# Patient Record
Sex: Male | Born: 1955 | Race: White | Hispanic: No | Marital: Married | Smoking: Never smoker
Health system: Southern US, Community
[De-identification: ages and names within clinical notes are randomized; demographics above are authoritative.]

## PROBLEM LIST (undated history)

## (undated) DIAGNOSIS — J310 Chronic rhinitis: Secondary | ICD-10-CM

## (undated) DIAGNOSIS — E785 Hyperlipidemia, unspecified: Secondary | ICD-10-CM

## (undated) DIAGNOSIS — R0981 Nasal congestion: Secondary | ICD-10-CM

## (undated) DIAGNOSIS — M25519 Pain in unspecified shoulder: Secondary | ICD-10-CM

## (undated) DIAGNOSIS — L989 Disorder of the skin and subcutaneous tissue, unspecified: Secondary | ICD-10-CM

## (undated) HISTORY — DX: Hyperlipidemia, unspecified: E78.5

## (undated) HISTORY — DX: Pain in unspecified shoulder: M25.519

## (undated) HISTORY — DX: Disorder of the skin and subcutaneous tissue, unspecified: L98.9

## (undated) HISTORY — DX: Nasal congestion: R09.81

## (undated) HISTORY — DX: Chronic rhinitis: J31.0

---

## 1998-02-15 DIAGNOSIS — E785 Hyperlipidemia, unspecified: Secondary | ICD-10-CM

## 1998-02-15 HISTORY — DX: Hyperlipidemia, unspecified: E78.5

## 1998-02-15 HISTORY — PX: OTHER SURGICAL HISTORY: SHX169

## 2003-02-16 DIAGNOSIS — J310 Chronic rhinitis: Secondary | ICD-10-CM

## 2003-02-16 HISTORY — DX: Chronic rhinitis: J31.0

## 2004-04-03 ENCOUNTER — Ambulatory Visit: Payer: Self-pay | Admitting: Sports Medicine

## 2004-04-03 ENCOUNTER — Encounter: Admission: RE | Admit: 2004-04-03 | Discharge: 2004-04-03 | Payer: Self-pay | Admitting: Sports Medicine

## 2004-05-01 ENCOUNTER — Ambulatory Visit: Payer: Self-pay | Admitting: Sports Medicine

## 2010-07-15 ENCOUNTER — Encounter: Payer: Self-pay | Admitting: Physician Assistant

## 2012-06-05 ENCOUNTER — Other Ambulatory Visit: Payer: Self-pay

## 2012-06-05 ENCOUNTER — Encounter: Payer: Self-pay | Admitting: *Deleted

## 2012-06-05 DIAGNOSIS — R5383 Other fatigue: Secondary | ICD-10-CM

## 2012-06-05 DIAGNOSIS — R5381 Other malaise: Secondary | ICD-10-CM

## 2012-06-05 LAB — POCT CBC
MCHC: 36.7 g/dL — AB (ref 31.8–35.4)
MPV: 9.5 fL (ref 0–99.8)
POC Granulocyte: 3.6 (ref 2–6.9)
POC LYMPH PERCENT: 29.3 %L (ref 10–50)
Platelet Count, POC: 171 10*3/uL (ref 142–424)
RDW, POC: 13.8 %
WBC: 5.5 10*3/uL (ref 4.6–10.2)

## 2012-06-05 LAB — HEPATIC FUNCTION PANEL
Albumin: 4.4 g/dL (ref 3.5–5.2)
Alkaline Phosphatase: 56 U/L (ref 39–117)
Indirect Bilirubin: 0.6 mg/dL (ref 0.0–0.9)
Total Bilirubin: 0.7 mg/dL (ref 0.3–1.2)

## 2012-06-05 LAB — BASIC METABOLIC PANEL
Calcium: 9.6 mg/dL (ref 8.4–10.5)
Sodium: 141 mEq/L (ref 135–145)

## 2012-06-06 LAB — NMR LIPOPROFILE WITH LIPIDS
HDL Particle Number: 38.7 umol/L (ref >=30.5)
HDL Size: 8.7 nm — ABNORMAL LOW (ref >=9.2)
HDL-C: 47 mg/dL (ref >=40)
LP-IR Score: 50 — ABNORMAL HIGH (ref <=45)
Large HDL-P: 3.8 umol/L — ABNORMAL LOW (ref >=4.8)
Triglycerides: 74 mg/dL (ref <150)

## 2012-06-09 NOTE — Progress Notes (Signed)
LMOM

## 2012-06-20 ENCOUNTER — Encounter: Payer: Self-pay | Admitting: Family Medicine

## 2012-06-20 ENCOUNTER — Ambulatory Visit (INDEPENDENT_AMBULATORY_CARE_PROVIDER_SITE_OTHER): Payer: BC Managed Care – PPO | Admitting: Family Medicine

## 2012-06-20 VITALS — BP 142/72 | HR 79 | Temp 97.7°F | Wt 173.6 lb

## 2012-06-20 DIAGNOSIS — E785 Hyperlipidemia, unspecified: Secondary | ICD-10-CM

## 2012-06-20 DIAGNOSIS — M1990 Unspecified osteoarthritis, unspecified site: Secondary | ICD-10-CM

## 2012-06-20 MED ORDER — ATORVASTATIN CALCIUM 40 MG PO TABS: 40.0000 mg | ORAL_TABLET | Freq: Every day | ORAL | Status: DC

## 2012-06-20 MED ORDER — HYDROCODONE-ACETAMINOPHEN 5-500 MG PO TABS: 1.0000 | ORAL_TABLET | Freq: Two times a day (BID) | ORAL | Status: DC | PRN

## 2012-06-20 MED ORDER — MELOXICAM 7.5 MG PO TABS: 7.5000 mg | ORAL_TABLET | ORAL | Status: DC | PRN

## 2012-06-20 NOTE — Progress Notes (Signed)
Subjective:     Patient ID: Frank Daniel, male   DOB: 25-Jun-1955, 57 y.o.   MRN: [id number]  HPI Patient is here for follow up of hyperlipidemia/osteoarthritis : denies Headache;denies Chest Pain;denies weakness;denies Shortness of Breath and orthopnea;denies Visual changes;denies palpitations;denies cough;denies pedal edema;denies symptoms of TIA or stroke;deniesClaudication symptoms. admits to Compliance with medications; denies Problems with medications.  Past Medical History  Diagnosis Date  . Shoulder pain   . Hyperlipidemia 2000  . Skin lesions, generalized   . Sinus congestion   . Rhinitis 2005   Past Surgical History  Procedure Laterality Date  . Left shoulder  2000   Current Outpatient Prescriptions on File Prior to Visit  Medication Sig Dispense Refill  . diclofenac (VOLTAREN) 75 MG EC tablet Take 75 mg by mouth 2 (two) times daily.        . simvastatin (ZOCOR) 40 MG tablet Take 40 mg by mouth at bedtime.         No current facility-administered medications on file prior to visit.   No Known Allergies  There is no immunization history on file for this patient. History   Social History  . Marital Status: Married    Spouse Name: N/A    Number of Children: N/A  . Years of Education: N/A   Occupational History  . Not on file.   Social History Main Topics  . Smoking status: Not on file  . Smokeless tobacco: Not on file  . Alcohol Use: Not on file  . Drug Use: Not on file  . Sexually Active: Not on file   Other Topics Concern  . Not on file   Social History Narrative  . No narrative on file    Review of Systems No complaints today.    Objective:   Physical Exam    APPEARANCE:  Patient in no acute distress.The patient appeared well nourished and normally developed. Acyanotic. Waist: VITAL SIGNS:BP 142/72  Pulse 79  Temp(Src) 97.7 F (36.5 C)  Wt 173 lb 9.6 oz (78.744 kg)    SKIN: warm and  Dry without overt rashes, tattoos and  scars  HEAD and Neck: without JVD, Head and scalp: normal Eyes:No scleral icterus. Fundi normal, eye movements normal. Ears: Auricle normal, canal normal, Tympanic membranes normal, insufflation normal. Nose: normal Throat: normal Neck & thyroid: normal  CHEST & LUNGS: Chest wall: normal Lungs: Clear  CVS: Reveals the PMI to be normally located. Regular rhythm, First and Second Heart sounds are normal,  absence of murmurs, rubs or gallops. Peripheral vasculature: Radial pulses: normal Dorsal pedis pulses: normal Posterior pulses: normal  ABDOMEN:  Appearance: normal Benign,, no organomegaly, no masses, no Abdominal Aortic enlargement. No Guarding , no rebound. No Bruits. Bowel sounds: normal  RECTAL: N/A GU: N/A  EXTREMETIES: nonedematous. Both Femoral and Pedal pulses are normal.  MUSCULOSKELETAL:  Spine: normal Joints: intact  NEUROLOGIC: oriented to time,place and person; nonfocal. Strength is normal Sensory is normal Reflexes are normal Cranial Nerves are normal.  Assessment:     Osteoarthritis - Plan: HYDROcodone-acetaminophen (VICODIN) 5-500 MG per tablet, HYDROcodone-acetaminophen (VICODIN) 5-500 MG per tablet  HLD (hyperlipidemia) - Plan: COMPLETE METABOLIC PANEL WITH GFR, NMR Lipoprofile with Lipids       Plan:   Results for orders placed in visit on 06/05/12  BASIC METABOLIC PANEL      Result Value Range   Sodium 141  135 - 145 mEq/L   Potassium 5.1  3.5 - 5.3 mEq/L   Chloride 103  96 - 112 mEq/L   CO2 32  19 - 32 mEq/L   Glucose, Bld 95  70 - 99 mg/dL   BUN 13  6 - 23 mg/dL   Creat 1.61  0.96 - 0.45 mg/dL   Calcium 9.6  8.4 - 40.9 mg/dL  HEPATIC FUNCTION PANEL      Result Value Range   Total Bilirubin 0.7  0.3 - 1.2 mg/dL   Bilirubin, Direct 0.1  0.0 - 0.3 mg/dL   Indirect Bilirubin 0.6  0.0 - 0.9 mg/dL   Alkaline Phosphatase 56  39 - 117 U/L   AST 35  0 - 37 U/L   ALT 56 (*) 0 - 53 U/L   Total Protein 6.8  6.0 - 8.3 g/dL   Albumin  4.4  3.5 - 5.2 g/dL  NMR LIPOPROFILE WITH LIPIDS      Result Value Range   LDL Particle Number [id number] (*) <1000 nmol/L   LDL (calc) 76  <100 mg/dL   HDL-C 47  >=81 mg/dL   Triglycerides 74  <191 mg/dL   Cholesterol, Total 478  <200 mg/dL   HDL Particle Number 29.5  >=62.1 umol/L   Large HDL-P 3.8 (*) >=4.8 umol/L   Large VLDL-P 1.5  <=2.7 nmol/L   Small LDL Particle Number [id number] (*) <=527 nmol/L   LDL Size 20.2 (*) >20.5 nm   HDL Size 8.7 (*) >=9.2 nm   VLDL Size 43.7  <=46.6 nm   LP-IR Score 50 (*) <=45  POCT CBC      Result Value Range   WBC 5.5  4.6 - 10.2 K/uL   Lymph, poc 1.6  0.6 - 3.4   POC LYMPH PERCENT 29.3  10 - 50 %L   POC Granulocyte 3.6  2 - 6.9   Granulocyte percent 65.2  37 - 80 %G   RBC 4.8  4.69 - 6.13 M/uL   Hemoglobin 15.5  14.1 - 18.1 g/dL   HCT, POC 30.8 (*) 65.7 - 53.7 %   MCV 87.8  80 - 97 fL   MCH, POC 32.3 (*) 27 - 31.2 pg   MCHC 36.7 (*) 31.8 - 35.4 g/dL   RDW, POC 84.6     Platelet Count, POC 171.0  142 - 424 K/uL   MPV 9.5  0 - 99.8 fL   Meds ordered this encounter  Medications  . DISCONTD: atorvastatin (LIPITOR) 40 MG tablet    Sig: Take 40 mg by mouth daily.  Marland Kitchen DISCONTD: meloxicam (MOBIC) 7.5 MG tablet    Sig: Take 7.5 mg by mouth as needed for pain.  Marland Kitchen DISCONTD: HYDROcodone-acetaminophen (VICODIN) 5-500 MG per tablet    Sig: Take 1 tablet by mouth every 12 (twelve) hours as needed for pain.  . meloxicam (MOBIC) 7.5 MG tablet    Sig: Take 1 tablet (7.5 mg total) by mouth as needed for pain.    Dispense:  30 tablet    Refill:  1  . DISCONTD: HYDROcodone-acetaminophen (VICODIN) 5-500 MG per tablet    Sig: Take 1 tablet by mouth every 12 (twelve) hours as needed for pain.    Dispense:  30 tablet    Refill:  0  . HYDROcodone-acetaminophen (VICODIN) 5-500 MG per tablet    Sig: Take 1 tablet by mouth every 12 (twelve) hours as needed for pain.    Dispense:  30 tablet    Refill:  0  . HYDROcodone-acetaminophen (VICODIN) 5-500 MG per tablet     Sig: Take  1 tablet by mouth every 12 (twelve) hours as needed for pain.    Dispense:  30 tablet    Refill:  0  . atorvastatin (LIPITOR) 40 MG tablet    Sig: Take 1 tablet (40 mg total) by mouth daily.    Dispense:  30 tablet    Refill:  5   Orders Placed This Encounter  Procedures  . COMPLETE METABOLIC PANEL WITH GFR    Standing Status: Future     Number of Occurrences:      Standing Expiration Date: 06/20/2013  . NMR Lipoprofile with Lipids    Standing Status: Future     Number of Occurrences:      Standing Expiration Date: 06/20/2013        Dr Woodroe Mode Recommendations  Diet and Exercise discussed with patient.  For nutrition information, I recommend books:  1).Eat to Live by Dr Monico Hoar. 2).Prevent and Reverse Heart Disease by Dr Suzzette Righter.  Exercise recommendations are:  If unable to walk, then the patient can exercise in a chair 3 times a day. By flapping arms like a bird gently and raising legs outwards to the front.  If ambulatory, the patient can go for walks for 30 minutes 3 times a week. Then increase the intensity and duration as tolerated.  Goal is to try to attain exercise frequency to 5 times a week.  If applicable: Best to perform resistance exercises (machines or weights) 2 days a week and cardio type exercises 3 days per week.  RTC 3 months . Labs prior.  Christy Ehrsam P. Modesto Charon, M.D.

## 2012-06-20 NOTE — Patient Instructions (Signed)
      Dr Masyn Rostro's Recommendations  Diet and Exercise discussed with patient.  For nutrition information, I recommend books:  1).Eat to Live by Dr Joel Fuhrman. 2).Prevent and Reverse Heart Disease by Dr Caldwell Esselstyn.  Exercise recommendations are:  If unable to walk, then the patient can exercise in a chair 3 times a day. By flapping arms like a bird gently and raising legs outwards to the front.  If ambulatory, the patient can go for walks for 30 minutes 3 times a week. Then increase the intensity and duration as tolerated.  Goal is to try to attain exercise frequency to 5 times a week.  If applicable: Best to perform resistance exercises (machines or weights) 2 days a week and cardio type exercises 3 days per week.  

## 2012-08-23 ENCOUNTER — Encounter: Payer: Self-pay | Admitting: *Deleted

## 2012-09-18 ENCOUNTER — Other Ambulatory Visit (INDEPENDENT_AMBULATORY_CARE_PROVIDER_SITE_OTHER): Payer: BC Managed Care – PPO

## 2012-09-18 DIAGNOSIS — E785 Hyperlipidemia, unspecified: Secondary | ICD-10-CM

## 2012-09-18 NOTE — Progress Notes (Signed)
Patient here today for labs only. °

## 2012-09-19 LAB — COMPLETE METABOLIC PANEL WITH GFR
ALT: 32 U/L (ref 0–53)
AST: 28 U/L (ref 0–37)
Albumin: 4.6 g/dL (ref 3.5–5.2)
Alkaline Phosphatase: 63 U/L (ref 39–117)
BUN: 9 mg/dL (ref 6–23)
CO2: 31 mEq/L (ref 19–32)
Calcium: 9.8 mg/dL (ref 8.4–10.5)
Chloride: 101 mEq/L (ref 96–112)
Creat: 1 mg/dL (ref 0.50–1.35)
GFR, Est African American: >89 mL/min
GFR, Est Non African American: 83 mL/min
Glucose, Bld: 93 mg/dL (ref 70–99)
Potassium: 4.8 mEq/L (ref 3.5–5.3)
Sodium: 140 mEq/L (ref 135–145)
Total Bilirubin: 0.7 mg/dL (ref 0.3–1.2)
Total Protein: 6.8 g/dL (ref 6.0–8.3)

## 2012-09-19 LAB — NMR LIPOPROFILE WITH LIPIDS
Cholesterol, Total: 106 mg/dL (ref <200)
HDL Particle Number: 32.4 umol/L (ref >=30.5)
HDL Size: 9.6 nm (ref >=9.2)
HDL-C: 42 mg/dL (ref >=40)
LDL (calc): 53 mg/dL (ref <100)
LDL Particle Number: 614 nmol/L (ref <1000)
LDL Size: 19.8 nm — ABNORMAL LOW (ref >20.5)
LP-IR Score: 35 (ref <=45)
Large HDL-P: 5.5 umol/L (ref >=4.8)
Large VLDL-P: 1.2 nmol/L (ref <=2.7)
Small LDL Particle Number: 407 nmol/L (ref <=527)
Triglycerides: 56 mg/dL (ref <150)
VLDL Size: 47.3 nm — ABNORMAL HIGH (ref <=46.6)

## 2012-09-20 ENCOUNTER — Ambulatory Visit: Payer: BC Managed Care – PPO | Admitting: Family Medicine

## 2012-09-20 NOTE — Progress Notes (Signed)
Quick Note:  Lab result at goal. No change in Medications for now. No Change in plans and follow up. ______ 

## 2012-09-22 ENCOUNTER — Ambulatory Visit (INDEPENDENT_AMBULATORY_CARE_PROVIDER_SITE_OTHER): Payer: BC Managed Care – PPO | Admitting: Family Medicine

## 2012-09-22 ENCOUNTER — Encounter: Payer: Self-pay | Admitting: Family Medicine

## 2012-09-22 VITALS — BP 111/67 | HR 60 | Temp 98.1°F | Wt 155.2 lb

## 2012-09-22 DIAGNOSIS — E785 Hyperlipidemia, unspecified: Secondary | ICD-10-CM

## 2012-09-22 DIAGNOSIS — Z23 Encounter for immunization: Secondary | ICD-10-CM

## 2012-09-22 MED ORDER — ATORVASTATIN CALCIUM 20 MG PO TABS: 20.0000 mg | ORAL_TABLET | Freq: Every day | ORAL | Status: DC

## 2012-09-22 NOTE — Patient Instructions (Signed)

## 2012-09-22 NOTE — Progress Notes (Signed)
Patient ID: Frank Daniel, male   DOB: 1956/01/07, 57 y.o.   MRN: [id number] SUBJECTIVE: CC: Chief Complaint  Patient presents with  . Follow-up    4 month follow up lost 17 .8 lbs since may been reading book recommended    HPI: Patient is here for follow up of hyperlipidemia: denies Headache;denies Chest Pain;denies weakness;denies Shortness of Breath and orthopnea;denies Visual changes;denies palpitations;denies cough;denies pedal edema;denies symptoms of TIA or stroke;deniesClaudication symptoms. admits to Compliance with medications; denies Problems with medications.  Doing great. Needs a new wardrobe.  Past Medical History  Diagnosis Date  . Shoulder pain   . Hyperlipidemia 2000  . Skin lesions, generalized   . Sinus congestion   . Rhinitis 2005   Past Surgical History  Procedure Laterality Date  . Left shoulder  2000   History   Social History  . Marital Status: Married    Spouse Name: N/A    Number of Children: N/A  . Years of Education: N/A   Occupational History  . Not on file.   Social History Main Topics  . Smoking status: Never Smoker   . Smokeless tobacco: Not on file  . Alcohol Use: No  . Drug Use: No  . Sexually Active: Not on file   Other Topics Concern  . Not on file   Social History Narrative  . No narrative on file   Family History  Problem Relation Age of Onset  . Coronary artery disease Mother     family history   . Heart attack Mother   . Heart attack Father   . Peripheral vascular disease Father   . Hypertension Father   . Hypertension Mother   . Coronary artery disease Brother   . Testicular cancer Brother    Current Outpatient Prescriptions on File Prior to Visit  Medication Sig Dispense Refill  . atorvastatin (LIPITOR) 40 MG tablet Take 1 tablet (40 mg total) by mouth daily.  30 tablet  5  . meloxicam (MOBIC) 7.5 MG tablet Take 1 tablet (7.5 mg total) by mouth as needed for pain.  30 tablet  1  . HYDROcodone-acetaminophen  (VICODIN) 5-500 MG per tablet Take 1 tablet by mouth every 12 (twelve) hours as needed for pain.  30 tablet  0   No current facility-administered medications on file prior to visit.   No Known Allergies  There is no immunization history on file for this patient. Prior to Admission medications   Medication Sig Start Date End Date Taking? Authorizing Provider  atorvastatin (LIPITOR) 40 MG tablet Take 1 tablet (40 mg total) by mouth daily. 06/20/12  Yes Ileana Ladd, MD  meloxicam (MOBIC) 7.5 MG tablet Take 1 tablet (7.5 mg total) by mouth as needed for pain. 06/20/12  Yes Ileana Ladd, MD  HYDROcodone-acetaminophen (VICODIN) 5-500 MG per tablet Take 1 tablet by mouth every 12 (twelve) hours as needed for pain. 06/20/12   Ileana Ladd, MD     ROS: As above in the HPI. All other systems are stable or negative.  OBJECTIVE: APPEARANCE:  Patient in no acute distress.The patient appeared well nourished and normally developed. Acyanotic. Waist: VITAL SIGNS:BP 111/67  Pulse 60  Temp(Src) 98.1 F (36.7 C) (Oral)  Wt 155 lb 3.2 oz (70.398 kg) WM looks well  SKIN: warm and  Dry without overt rashes, tattoos and scars  HEAD and Neck: without JVD, Head and scalp: normal Eyes:No scleral icterus. Fundi normal, eye movements normal. Ears: Auricle normal, canal normal,  Tympanic membranes normal, insufflation normal. Nose: normal Throat: normal Neck & thyroid: normal  CHEST & LUNGS: Chest wall: normal Lungs: Clear  CVS: Reveals the PMI to be normally located. Regular rhythm, First and Second Heart sounds are normal,  absence of murmurs, rubs or gallops. Peripheral vasculature: Radial pulses: normal Dorsal pedis pulses: normal Posterior pulses: normal  ABDOMEN:  Appearance: normal Benign, no organomegaly, no masses, no Abdominal Aortic enlargement. No Guarding , no rebound. No Bruits. Bowel sounds: normal  RECTAL: N/A GU: N/A  EXTREMETIES: nonedematous. Both Femoral and  Pedal pulses are normal.  MUSCULOSKELETAL:  Spine: normal Joints: intact  NEUROLOGIC: oriented to time,place and person; nonfocal. Strength is normal Sensory is normal Reflexes are normal Cranial Nerves are normal.  Results for orders placed in visit on 09/18/12  COMPLETE METABOLIC PANEL WITH GFR      Result Value Range   Sodium 140  135 - 145 mEq/L   Potassium 4.8  3.5 - 5.3 mEq/L   Chloride 101  96 - 112 mEq/L   CO2 31  19 - 32 mEq/L   Glucose, Bld 93  70 - 99 mg/dL   BUN 9  6 - 23 mg/dL   Creat 4.09  8.11 - 9.14 mg/dL   Total Bilirubin 0.7  0.3 - 1.2 mg/dL   Alkaline Phosphatase 63  39 - 117 U/L   AST 28  0 - 37 U/L   ALT 32  0 - 53 U/L   Total Protein 6.8  6.0 - 8.3 g/dL   Albumin 4.6  3.5 - 5.2 g/dL   Calcium 9.8  8.4 - 78.2 mg/dL   GFR, Est African American >89     GFR, Est Non African American 83    NMR LIPOPROFILE WITH LIPIDS      Result Value Range   LDL Particle Number 614  <1000 nmol/L   LDL (calc) 53  <100 mg/dL   HDL-C 42  >=95 mg/dL   Triglycerides 56  <621 mg/dL   Cholesterol, Total 308  <200 mg/dL   HDL Particle Number 65.7  >=84.6 umol/L   Large HDL-P 5.5  >=4.8 umol/L   Large VLDL-P 1.2  <=2.7 nmol/L   Small LDL Particle Number 407  <=527 nmol/L   LDL Size 19.8 (*) >20.5 nm   HDL Size 9.6  >=9.2 nm   VLDL Size 47.3 (*) <=46.6 nm   LP-IR Score 35  <=45    ASSESSMENT: Need for Tdap vaccination - Plan: Tdap vaccine greater than or equal to 7yo IM  HLD (hyperlipidemia) - Plan: atorvastatin (LIPITOR) 20 MG tablet  PLAN: Orders Placed This Encounter  Procedures  . Tdap vaccine greater than or equal to 7yo IM   Meds ordered this encounter  Medications  . atorvastatin (LIPITOR) 20 MG tablet    Sig: Take 1 tablet (20 mg total) by mouth daily.    Dispense:  30 tablet    Refill:  5   Reduced the atorvastatin, because he has done so well and he has lowered his LDLc and passed the goal.  Return in about 4 months (around 01/22/2013) for Recheck  medical problems.  Biran Mayberry P. Modesto Charon, M.D.  .

## 2012-09-22 NOTE — Progress Notes (Signed)
Tolerated tdap without difficulty 

## 2012-09-27 ENCOUNTER — Telehealth: Payer: Self-pay | Admitting: Family Medicine

## 2012-09-28 NOTE — Telephone Encounter (Signed)
The drug store notified and confirmed they do have his rx for atorvastatain and rready for pick up . They said they will call him

## 2012-12-21 ENCOUNTER — Other Ambulatory Visit: Payer: Self-pay

## 2013-01-09 ENCOUNTER — Encounter (HOSPITAL_COMMUNITY): Payer: Self-pay | Admitting: Emergency Medicine

## 2013-01-09 ENCOUNTER — Emergency Department (HOSPITAL_COMMUNITY)
Admission: EM | Admit: 2013-01-09 | Discharge: 2013-01-09 | Disposition: A | Discharge status: Home / Self Care | Payer: BC Managed Care – PPO | Attending: Emergency Medicine | Admitting: Emergency Medicine

## 2013-01-09 ENCOUNTER — Emergency Department (HOSPITAL_COMMUNITY): Payer: BC Managed Care – PPO

## 2013-01-09 ENCOUNTER — Encounter: Payer: Self-pay | Admitting: Family Medicine

## 2013-01-09 ENCOUNTER — Ambulatory Visit (INDEPENDENT_AMBULATORY_CARE_PROVIDER_SITE_OTHER): Payer: BC Managed Care – PPO | Admitting: Family Medicine

## 2013-01-09 VITALS — BP 132/78 | HR 69 | Temp 97.4°F | Ht 67.75 in | Wt 157.0 lb

## 2013-01-09 DIAGNOSIS — R42 Dizziness and giddiness: Secondary | ICD-10-CM

## 2013-01-09 DIAGNOSIS — Z7982 Long term (current) use of aspirin: Secondary | ICD-10-CM | POA: Insufficient documentation

## 2013-01-09 DIAGNOSIS — E785 Hyperlipidemia, unspecified: Secondary | ICD-10-CM | POA: Insufficient documentation

## 2013-01-09 DIAGNOSIS — I200 Unstable angina: Secondary | ICD-10-CM

## 2013-01-09 DIAGNOSIS — Z79899 Other long term (current) drug therapy: Secondary | ICD-10-CM | POA: Insufficient documentation

## 2013-01-09 DIAGNOSIS — Z8709 Personal history of other diseases of the respiratory system: Secondary | ICD-10-CM | POA: Insufficient documentation

## 2013-01-09 DIAGNOSIS — R55 Syncope and collapse: Secondary | ICD-10-CM | POA: Insufficient documentation

## 2013-01-09 LAB — COMPREHENSIVE METABOLIC PANEL
ALT: 24 U/L (ref 0–53)
Alkaline Phosphatase: 64 U/L (ref 39–117)
BUN: 11 mg/dL (ref 6–23)
CO2: 27 mEq/L (ref 19–32)
GFR calc Af Amer: >90 mL/min (ref >90)
GFR calc non Af Amer: >90 mL/min (ref >90)
Glucose, Bld: 91 mg/dL (ref 70–99)
Potassium: 4.4 mEq/L (ref 3.5–5.1)
Sodium: 138 mEq/L (ref 135–145)
Total Bilirubin: 0.4 mg/dL (ref 0.3–1.2)
Total Protein: 7.1 g/dL (ref 6.0–8.3)

## 2013-01-09 LAB — CBC WITH DIFFERENTIAL/PLATELET
Eosinophils Absolute: 0 10*3/uL (ref 0.0–0.7)
Hemoglobin: 14.4 g/dL (ref 13.0–17.0)
Lymphocytes Relative: 18 % (ref 12–46)
Lymphs Abs: 1.2 10*3/uL (ref 0.7–4.0)
MCH: 30.1 pg (ref 26.0–34.0)
MCV: 86.4 fL (ref 78.0–100.0)
Monocytes Relative: 7 % (ref 3–12)
Neutrophils Relative %: 75 % (ref 43–77)
Platelets: 207 10*3/uL (ref 150–400)
RBC: 4.78 MIL/uL (ref 4.22–5.81)
WBC: 6.8 10*3/uL (ref 4.0–10.5)

## 2013-01-09 LAB — POCT CBC
Granulocyte percent: 79.3 %G (ref 37–80)
HCT, POC: 43.7 % (ref 43.5–53.7)
MCV: 87 fL (ref 80–97)
POC Granulocyte: 5.6 (ref 2–6.9)
Platelet Count, POC: 188 10*3/uL (ref 142–424)
RBC: 5 M/uL (ref 4.69–6.13)

## 2013-01-09 LAB — TROPONIN I: Troponin I: <0.3 ng/mL (ref <0.30)

## 2013-01-09 MED ORDER — SODIUM CHLORIDE 0.9 % IV BOLUS (SEPSIS)
1000.0000 mL | Freq: Once | INTRAVENOUS | Status: AC
  Administered 2013-01-09: 1000 mL via INTRAVENOUS

## 2013-01-09 MED ORDER — ASPIRIN 325 MG PO TABS: 325.0000 mg | ORAL_TABLET | Freq: Once | ORAL | Status: DC

## 2013-01-09 NOTE — ED Notes (Signed)
Denies dizziness, chest pain, sob, n/v, changes in vision

## 2013-01-09 NOTE — ED Notes (Signed)
Denies any symptoms at this time.

## 2013-01-09 NOTE — Progress Notes (Signed)
    Subjective:    Patient ID: Frank Daniel, male    DOB: January 07, 1956, 57 y.o.   MRN: [id number]  HPI Patient presents today with chief complaint of multiple episodes of dizziness and diaphoresis. Patient states he woke up this morning and was about to eat cereal when he developed sudden onset of severe dizziness and diaphoresis. Patient's hysterectomy recently because he had the feeling of it he was about to pass out. Patient said he felt very weak after this and called out of work. Patient states he took a nap woke up later in the day and while sitting at his desk had sudden onset of symptoms again with dizziness and diaphoresis. Patient has immediately laid down on the floor. Has been fairly asymptomatic since this point. Patient denies any chest pains or shortness of breath associated with symptoms. Seemed to be not related to exertion. No recent infections. Prior history of hyperlipidemia that has gotten better with diet and medication. Significant family history of heart disease including mother father and brother all with either myocardial infarctions or bypass surgery. The patient states that he had a stress test greater than 15 years ago that was normal. Nonsmoker. No hemiparesis or confusion. Does take a baby aspirin daily  Cardiovascular risk factors: Age, family history, hyperlipidemia  There are no active problems to display for this patient.   Review of Systems  All other systems reviewed and are negative.       Objective:   Physical Exam  Constitutional: He is oriented to person, place, and time. He appears well-developed and well-nourished.  HENT:  Head: Normocephalic and atraumatic.  Eyes: Conjunctivae are normal. Pupils are equal, round, and reactive to light.  Neck: Normal range of motion. Neck supple.  Cardiovascular: Normal rate and normal heart sounds.   Pulmonary/Chest: Effort normal and breath sounds normal.  Abdominal: Soft.  Musculoskeletal: Normal  range of motion.  Neurological: He is alert and oriented to person, place, and time.  Skin: Skin is warm.   Filed Vitals:   01/09/13 1703  BP: 132/78  Pulse: 69  Temp:    EKG: NSR   Lab Results  Component Value Date   WBC 7.1 01/09/2013   HGB 14.9 01/09/2013   HCT 43.7 01/09/2013   MCV 87.0 01/09/2013          Assessment & Plan:  Dizziness - Plan: CMP14+EGFR, POCT CBC, EKG 12-Lead  Unstable angina - Plan: aspirin tablet 325 mg   Differential diagnoses is fairly broad for symptoms with a higher concern for cardiogenic source of symptoms i.e. unstable angina.  Symptoms fairly atypical in patient with multiple cardiovascular risk factors especially family history. Workup has been otherwise negative thus far. Given that patient is being seen in the transition to after hour care (after 5pm), it would be very difficult for pt to be seen as same day appt in local cardiology office. Recommend the patient go to the ER for further evaluation symptoms as patient may benefit from inpatient evaluation of symptoms with possible inpatient vs. outpatient stress test and cardiology consult. Patient given full dose ASA and supplemental O2. Discussed overall plan of care with patient and wife and they are agreeable.

## 2013-01-09 NOTE — ED Notes (Signed)
Patient states that this morning he was at home standing and became dizzy, walked backed to his bedroom and sat down on his bed, at that point he became diaphoretic and laid down.  Patient reports feeling drained and tired today.  Second time patient felt dizzy he was sitting in a recliner sitting upright and starting feeling lightheaded again.  He laid down on the floor for fear of falling and again broke out in a sweat.  Patient's wife states that at this time his skin color changed to a pale color and he broke out in a sweat.  Patient went to PCP and was sent here. While at PCP pt received a 325mg  ASA and had an EKG and labs drawn.

## 2013-01-09 NOTE — ED Notes (Signed)
Pt from home. Had two episodes of near syncope, last at 1330. Went to PCP at 1530 and was sent to the ER. Pt alert and oriented x 4, neuro intact at this time.Denies dizziness, light headedness, nausea, or vomiting.

## 2013-01-09 NOTE — ED Notes (Signed)
Patient ambulated with no apparent difficulties, denies h/a, dizziness, sob, chest pain

## 2013-01-09 NOTE — ED Provider Notes (Signed)
CSN: [id number]     Arrival date & time 01/09/13  1856 History   First MD Initiated Contact with Patient 01/09/13 1858     Chief Complaint  Patient presents with  . Near Syncope   (Consider location/radiation/quality/duration/timing/severity/associated sxs/prior Treatment) HPI Comments: Patient is a 57 year old male history of high cholesterol. Presents today with complaints of 2 episodes of near-syncope which occurred at home. He states he woke up this morning walked into the kitchen and became suddenly lightheaded and felt as if he was going to pass out. He sat down on the floor and after several minutes this feeling resolved. He then called in sick to work and went back to bed. He woke up around noon and had another similar episode. He was seen at urgent care and was sent here for further evaluation. He states that he feels fine at present. He denies any chest pain, shortness of breath, abdominal pain. He denies any black or bloody stool. He denies any hearing loss or ringing in years. He has no prior cardiac history.  The history is provided by the patient.    Past Medical History  Diagnosis Date  . Shoulder pain   . Hyperlipidemia 2000  . Skin lesions, generalized   . Sinus congestion   . Rhinitis 2005   Past Surgical History  Procedure Laterality Date  . Left shoulder  2000   Family History  Problem Relation Age of Onset  . Coronary artery disease Mother     family history   . Heart attack Mother   . Hypertension Mother   . Heart attack Father   . Peripheral vascular disease Father   . Hypertension Father   . Coronary artery disease Brother   . Testicular cancer Brother   . Heart attack Brother 55   History  Substance Use Topics  . Smoking status: Never Smoker   . Smokeless tobacco: Not on file  . Alcohol Use: No    Review of Systems  All other systems reviewed and are negative.    Allergies  Review of patient's allergies indicates no known allergies.  Home  Medications   Current Outpatient Rx  Name  Route  Sig  Dispense  Refill  . aspirin 325 MG tablet   Oral   Take 325 mg by mouth daily. Pt states that he scores the tablet into fours and takes 4 times daily. 325/4= 81.25         . atorvastatin (LIPITOR) 20 MG tablet   Oral   Take 1 tablet (20 mg total) by mouth daily.   30 tablet   5   . co-enzyme Q-10 30 MG capsule   Oral   Take 30 mg by mouth 3 (three) times daily.         . Multiple Vitamin (MULTIVITAMIN WITH MINERALS) TABS tablet   Oral   Take 1 tablet by mouth daily.         . Omega-3 Fatty Acids (OMEGA 3 PO)   Oral   Take 2 capsules by mouth daily.          BP 155/73  Pulse 72  Temp(Src) 98.4 F (36.9 C) (Oral)  SpO2 100% Physical Exam  Nursing note and vitals reviewed. Constitutional: He is oriented to person, place, and time. He appears well-developed and well-nourished. No distress.  HENT:  Head: Normocephalic and atraumatic.  Mouth/Throat: Oropharynx is clear and moist.  Eyes: EOM are normal. Pupils are equal, round, and reactive to light.  Neck:  Normal range of motion. Neck supple.  Cardiovascular: Normal rate, regular rhythm and normal heart sounds.   No murmur heard. Pulmonary/Chest: Effort normal and breath sounds normal. No respiratory distress. He has no wheezes.  Abdominal: Soft. Bowel sounds are normal. He exhibits no distension. There is no tenderness.  Musculoskeletal: Normal range of motion. He exhibits no edema.  Lymphadenopathy:    He has no cervical adenopathy.  Neurological: He is alert and oriented to person, place, and time. No cranial nerve deficit. He exhibits normal muscle tone. Coordination normal.  Skin: Skin is warm and dry. He is not diaphoretic.    ED Course  Procedures (including critical care time) Labs Review Labs Reviewed  CBC WITH DIFFERENTIAL  COMPREHENSIVE METABOLIC PANEL  TROPONIN I   Imaging Review No results found.   Date: 01/09/2013  Rate: 69  Rhythm:  normal sinus rhythm  QRS Axis: normal  Intervals: normal  ST/T Wave abnormalities: normal  Conduction Disutrbances:none  Narrative Interpretation:   Old EKG Reviewed: unchanged    MDM  No diagnosis found. Patient is a 57 year old male who presents after 2 near syncopal episodes that occurred at home. Workup reveals a normal EKG along with normal blood counts and electrolytes. There is no anemia and no other explanation for his symptoms. His vital signs have remained stable and he has had no arrhythmias on the monitor. CT scan of the head was read as possible subdural hematoma by the radiologist. I reviewed this study and also discussed it with Dr. Venetia Maxon from neurosurgery. He agrees with my assessment that this is likely artifact. Patient has had no trauma and does not fit the clinical picture. At this point I feel as though he is stable for discharge. I will recommend plenty of fluids, rest, and time. He is to followup with his Dr. if not improving in the next several days and return if his symptoms worsen or change.    Geoffery Lyons, MD 01/09/13 2250

## 2013-01-09 NOTE — ED Notes (Signed)
Dr. Delo at bedside. 

## 2013-01-10 LAB — CMP14+EGFR
Albumin/Globulin Ratio: 2.2 (ref 1.1–2.5)
Albumin: 4.4 g/dL (ref 3.5–5.5)
Alkaline Phosphatase: 61 IU/L (ref 39–117)
BUN/Creatinine Ratio: 11 (ref 9–20)
BUN: 11 mg/dL (ref 6–24)
Creatinine, Ser: 0.97 mg/dL (ref 0.76–1.27)
GFR calc Af Amer: 99 mL/min/{1.73_m2} (ref >59)
GFR calc non Af Amer: 86 mL/min/{1.73_m2} (ref >59)
Globulin, Total: 2 g/dL (ref 1.5–4.5)
Total Bilirubin: 0.4 mg/dL (ref 0.0–1.2)
Total Protein: 6.4 g/dL (ref 6.0–8.5)

## 2013-01-16 NOTE — Progress Notes (Signed)
Quick Note:  Released to My Chart ______

## 2013-01-25 ENCOUNTER — Ambulatory Visit: Payer: BC Managed Care – PPO | Admitting: Family Medicine

## 2013-02-01 ENCOUNTER — Ambulatory Visit (INDEPENDENT_AMBULATORY_CARE_PROVIDER_SITE_OTHER): Payer: BC Managed Care – PPO | Admitting: Family Medicine

## 2013-02-01 ENCOUNTER — Encounter: Payer: Self-pay | Admitting: Family Medicine

## 2013-02-01 VITALS — BP 123/72 | HR 69 | Temp 97.6°F | Ht 67.75 in | Wt 155.6 lb

## 2013-02-01 DIAGNOSIS — Z23 Encounter for immunization: Secondary | ICD-10-CM | POA: Insufficient documentation

## 2013-02-01 DIAGNOSIS — R55 Syncope and collapse: Secondary | ICD-10-CM

## 2013-02-01 DIAGNOSIS — Z119 Encounter for screening for infectious and parasitic diseases, unspecified: Secondary | ICD-10-CM

## 2013-02-01 DIAGNOSIS — Z1211 Encounter for screening for malignant neoplasm of colon: Secondary | ICD-10-CM

## 2013-02-01 DIAGNOSIS — Z0000 Encounter for general adult medical examination without abnormal findings: Secondary | ICD-10-CM | POA: Insufficient documentation

## 2013-02-01 DIAGNOSIS — E785 Hyperlipidemia, unspecified: Secondary | ICD-10-CM | POA: Insufficient documentation

## 2013-02-01 MED ORDER — ATORVASTATIN CALCIUM 20 MG PO TABS
20.0000 mg | ORAL_TABLET | Freq: Every day | ORAL | Status: DC
Start: 2013-02-01 — End: 2013-09-25

## 2013-02-01 NOTE — Patient Instructions (Signed)
HEALTH MAINTENANCE Immunizations: Tetanus-Diphtheria Booster due:up to date next due 2024 Pertusis Booster [id number] Flu Shot Due: every Fall Pneumonia Vaccine: usually at 58 years of age unless there are certain risk situations. Herpes Zoster/Shingles Vaccine due: usually at 58 years of age HPV AVW:[id number] age 58 to 43 years in males and females.  Healthy Life Habits: Exercise Goal: 5-6 days/week; start gradually(ie 30 minutes/3days per week) Nutrition: Balanced healthy meals including Vegetables and Fruits. Consider  Reading the following books: 1) Eat to Live by Dr Ottis Stain; 2) Prevent and Reverse Heart Disease by Dr Suzzette Righter.  Vitamins:Okay Aspirin:1/4 of 325 mg daily Stop Tobacco Use:n/a Seat Belt Use:+++ recommended Sunscreen Use:+++ recommended  Recommended Screening Tests: Colon Cancer Screening: referral to GI Blood work: today Cholesterol Screening:   today         HIV:+              Hepatitis C(people born 20-1966): +   Monthly Self Testicular Exam:+++  Eye Exam: every 1 to 2 years recommended Dental Health: at least every 6 months  Others:    Living Will/Healthcare Power of Attorney: should have this in order with your personal estate planning

## 2013-02-01 NOTE — Progress Notes (Signed)
Patient ID: Frank Daniel, male   DOB: Nov 01, 1955, 57 y.o.   MRN: [id number] SUBJECTIVE: CC: Chief Complaint  Patient presents with  . Annual Exam    HPI: Annual physical.  Recently seen for acute dizziness and sweating and nausea. Went to lie down and rested and came here after a second bout of dizziness. He was transferred to the [facility] where he had extensive work up.   Past Medical History  Diagnosis Date  . Shoulder pain   . Skin lesions, generalized   . Sinus congestion   . Rhinitis 2005  . Hyperlipidemia 2000   Past Surgical History  Procedure Laterality Date  . Left shoulder  2000   History   Social History  . Marital Status: Married    Spouse Name: N/A    Number of Children: N/A  . Years of Education: N/A   Occupational History  . Not on file.   Social History Main Topics  . Smoking status: Never Smoker   . Smokeless tobacco: Not on file  . Alcohol Use: No  . Drug Use: No  . Sexual Activity: Not on file   Other Topics Concern  . Not on file   Social History Narrative  . No narrative on file   Family History  Problem Relation Age of Onset  . Coronary artery disease Mother     family history   . Heart attack Mother   . Hypertension Mother   . Heart attack Father   . Peripheral vascular disease Father   . Hypertension Father   . Coronary artery disease Brother   . Testicular cancer Brother   . Heart attack Brother 42   Current Outpatient Prescriptions on File Prior to Visit  Medication Sig Dispense Refill  . co-enzyme Q-10 30 MG capsule Take 30 mg by mouth 3 (three) times daily.      . Multiple Vitamin (MULTIVITAMIN WITH MINERALS) TABS tablet Take 1 tablet by mouth daily.      . Omega-3 Fatty Acids (OMEGA 3 PO) Take 2 capsules by mouth daily.       Current Facility-Administered Medications on File Prior to Visit  Medication Dose Route Frequency Provider Last Rate Last Dose  . aspirin tablet 325 mg  325 mg Oral Once Doree Albee, MD       No  Known Allergies Immunization History  Administered Date(s) Administered  . Influenza,inj,Quad PF,36+ Mos 02/01/2013  . Tdap 09/22/2012   Prior to Admission medications   Medication Sig Start Date End Date Taking? Authorizing Provider  aspirin 325 MG tablet Take 80 mg by mouth daily.   Yes Historical Provider, MD  atorvastatin (LIPITOR) 20 MG tablet Take 1 tablet (20 mg total) by mouth daily. 09/22/12  Yes Ileana Ladd, MD  co-enzyme Q-10 30 MG capsule Take 30 mg by mouth 3 (three) times daily.   Yes Historical Provider, MD  Multiple Vitamin (MULTIVITAMIN WITH MINERALS) TABS tablet Take 1 tablet by mouth daily.   Yes Historical Provider, MD  Omega-3 Fatty Acids (OMEGA 3 PO) Take 2 capsules by mouth daily.   Yes Historical Provider, MD     ROS: As above in the HPI. All other systems are stable or negative.  OBJECTIVE: APPEARANCE:  Patient in no acute distress.The patient appeared well nourished and normally developed. Acyanotic. Waist: VITAL SIGNS:BP 123/72  Pulse 69  Temp(Src) 97.6 F (36.4 C) (Oral)  Ht 5' 7.75" (1.721 m)  Wt 155 lb 9.6 oz (70.58 kg)  BMI 23.83  kg/m2   SKIN: warm and  Dry without overt rashes, tattoos and scars  HEAD and Neck: without JVD, Head and scalp: normal Eyes:No scleral icterus. Fundi normal, eye movements normal. Ears: Auricle normal, canal normal, Tympanic membranes normal, insufflation normal. Nose: normal Throat: normal Neck & thyroid: normal  CHEST & LUNGS: Chest wall: normal Lungs: Clear  CVS: Reveals the PMI to be normally located. Regular rhythm, First and Second Heart sounds are normal,  absence of murmurs, rubs or gallops. Peripheral vasculature: Radial pulses: normal Dorsal pedis pulses: normal Posterior pulses: normal  ABDOMEN:  Appearance: normal Benign, no organomegaly, no masses, no Abdominal Aortic enlargement. No Guarding , no rebound. No Bruits. Bowel sounds: normal  RECTAL: Normal heme negative GU: left  testicle small.right testicle is normal size. No masses  EXTREMETIES: nonedematous.  MUSCULOSKELETAL:  Spine: normal Joints: intact  NEUROLOGIC: oriented to time,place and person; nonfocal. Strength is normal Sensory is normal Reflexes are normal Cranial Nerves are normal.  Results for orders placed during the hospital encounter of 01/09/13  CBC WITH DIFFERENTIAL      Result Value Range   WBC 6.8  4.0 - 10.5 K/uL   RBC 4.78  4.22 - 5.81 MIL/uL   Hemoglobin 14.4  13.0 - 17.0 g/dL   HCT 16.1  09.6 - 04.5 %   MCV 86.4  78.0 - 100.0 fL   MCH 30.1  26.0 - 34.0 pg   MCHC 34.9  30.0 - 36.0 g/dL   RDW 40.9  81.1 - 91.4 %   Platelets 207  150 - 400 K/uL   Neutrophils Relative % 75  43 - 77 %   Neutro Abs 5.1  1.7 - 7.7 K/uL   Lymphocytes Relative 18  12 - 46 %   Lymphs Abs 1.2  0.7 - 4.0 K/uL   Monocytes Relative 7  3 - 12 %   Monocytes Absolute 0.5  0.1 - 1.0 K/uL   Eosinophils Relative 0  0 - 5 %   Eosinophils Absolute 0.0  0.0 - 0.7 K/uL   Basophils Relative 0  0 - 1 %   Basophils Absolute 0.0  0.0 - 0.1 K/uL  COMPREHENSIVE METABOLIC PANEL      Result Value Range   Sodium 138  135 - 145 mEq/L   Potassium 4.4  3.5 - 5.1 mEq/L   Chloride 99  96 - 112 mEq/L   CO2 27  19 - 32 mEq/L   Glucose, Bld 91  70 - 99 mg/dL   BUN 11  6 - 23 mg/dL   Creatinine, Ser 7.82  0.50 - 1.35 mg/dL   Calcium 9.1  8.4 - 95.6 mg/dL   Total Protein 7.1  6.0 - 8.3 g/dL   Albumin 4.3  3.5 - 5.2 g/dL   AST 30  0 - 37 U/L   ALT 24  0 - 53 U/L   Alkaline Phosphatase 64  39 - 117 U/L   Total Bilirubin 0.4  0.3 - 1.2 mg/dL   GFR calc non Af Amer >90  >90 mL/min   GFR calc Af Amer >90  >90 mL/min  TROPONIN I      Result Value Range   Troponin I <0.30  <0.30 ng/mL    ASSESSMENT:  Annual physical exam  Hyperlipidemia - Plan: NMR, lipoprofile, TSH  Need for prophylactic vaccination and inoculation against influenza  Near syncope - Plan: EKG 12-Lead, TSH  Screening examination for infectious  disease - Plan: Hepatitis C antibody, HIV  antibody  Colon cancer screening - Plan: Ambulatory referral to Gastroenterology  HLD (hyperlipidemia) - Plan: atorvastatin (LIPITOR) 20 MG tablet Reviewed work up and records   PLAN:        HEALTH MAINTENANCE Immunizations: Tetanus-Diphtheria Booster due:up to date next due 2024 Pertusis Booster due:2024 Flu Shot Due: every Fall Pneumonia Vaccine: usually at 58 years of age unless there are certain risk situations. Herpes Zoster/Shingles Vaccine due: usually at 58 years of age HPV WUJ:[id number] age 1 to 80 years in males and females.  Healthy Life Habits: Exercise Goal: 5-6 days/week; start gradually(ie 30 minutes/3days per week) Nutrition: Balanced healthy meals including Vegetables and Fruits. Consider  Reading the following books: 1) Eat to Live by Dr Ottis Stain; 2) Prevent and Reverse Heart Disease by Dr Suzzette Righter.  Vitamins:Okay Aspirin:1/4 of 325 mg daily Stop Tobacco Use:n/a Seat Belt Use:+++ recommended Sunscreen Use:+++ recommended  Recommended Screening Tests: Colon Cancer Screening: referral to GI Blood work: today Cholesterol Screening:   today         HIV:+              Hepatitis C(people born 24-1966): +   Monthly Self Testicular Exam:+++  Eye Exam: every 1 to 2 years recommended Dental Health: at least every 6 months  Others:    Living Will/Healthcare Power of Attorney: should have this in order with your personal estate planning   Orders Placed This Encounter  Procedures  . NMR, lipoprofile  . Hepatitis C antibody  . HIV antibody  . TSH  . Ambulatory referral to Gastroenterology    Referral Priority:  Routine    Referral Type:  Consultation    Referral Reason:  Specialty Services Required    Requested Specialty:  Gastroenterology    Number of Visits Requested:  1  . EKG 12-Lead   Meds ordered this encounter  Medications  . aspirin 325 MG tablet    Sig: Take 80 mg by mouth daily.  Marland Kitchen  atorvastatin (LIPITOR) 20 MG tablet    Sig: Take 1 tablet (20 mg total) by mouth daily.    Dispense:  30 tablet    Refill:  5   Medications Discontinued During This Encounter  Medication Reason  . aspirin 325 MG tablet Entry Error  . atorvastatin (LIPITOR) 20 MG tablet Reorder   Return in about 4 months (around 06/02/2013) for Recheck medical problems.  Chestina Komatsu P. [place]esto Charon, M.D.

## 2013-02-03 LAB — NMR, LIPOPROFILE
Cholesterol: 120 mg/dL (ref <200)
HDL Cholesterol by NMR: 52 mg/dL (ref >=40)
HDL Particle Number: 32.5 umol/L (ref >=30.5)
LDL Particle Number: 849 nmol/L (ref <1000)
LDL Size: 20.1 nm — ABNORMAL LOW (ref >20.5)
LDLC SERPL CALC-MCNC: 58 mg/dL (ref <100)
LP-IR Score: 25 (ref <=45)
Small LDL Particle Number: 671 nmol/L — ABNORMAL HIGH (ref <=527)
Triglycerides by NMR: 49 mg/dL (ref <150)

## 2013-02-03 LAB — HEPATITIS C ANTIBODY: Hep C Virus Ab: <0.1 s/co ratio (ref 0.0–0.9)

## 2013-02-03 LAB — TSH: TSH: 1.02 u[IU]/mL (ref 0.450–4.500)

## 2013-02-03 LAB — HIV ANTIBODY (ROUTINE TESTING W REFLEX)
HIV 1/O/2 Abs-Index Value: <1 (ref <1.00)
HIV-1/HIV-2 Ab: NONREACTIVE

## 2013-03-08 ENCOUNTER — Encounter: Payer: Self-pay | Admitting: Family Medicine

## 2013-06-07 ENCOUNTER — Encounter: Payer: Self-pay | Admitting: Family Medicine

## 2013-06-07 ENCOUNTER — Ambulatory Visit (INDEPENDENT_AMBULATORY_CARE_PROVIDER_SITE_OTHER): Payer: BC Managed Care – PPO | Admitting: Family Medicine

## 2013-06-07 VITALS — BP 105/60 | HR 55 | Temp 98.1°F | Ht 71.0 in | Wt 157.0 lb

## 2013-06-07 DIAGNOSIS — E785 Hyperlipidemia, unspecified: Secondary | ICD-10-CM

## 2013-06-07 DIAGNOSIS — Z789 Other specified health status: Secondary | ICD-10-CM

## 2013-06-07 NOTE — Progress Notes (Signed)
Patient ID: Frank Daniel, male   DOB: 08/22/1955, 58 y.o.   MRN: [id number] SUBJECTIVE: CC: Chief Complaint  Patient presents with  . Follow-up    4 month follow up chronic problems no complaints    HPI: Patient is here for follow up of hyperlipidemia: denies Headache;denies Chest Pain;denies weakness;denies Shortness of Breath and orthopnea;denies Visual changes;denies palpitations;denies cough;denies pedal edema;denies symptoms of TIA or stroke;deniesClaudication symptoms. admits to Compliance with medications; denies Problems with medications. No problems and is close to being vegetarian. Has purchased books on nutrition that Dr Jacelyn Grip had recommended.    Past Medical History  Diagnosis Date  . Shoulder pain   . Skin lesions, generalized   . Sinus congestion   . Rhinitis 2005  . Hyperlipidemia 2000   Past Surgical History  Procedure Laterality Date  . Left shoulder  2000   History   Social History  . Marital Status: Married    Spouse Name: N/A    Number of Children: N/A  . Years of Education: N/A   Occupational History  . Not on file.   Social History Main Topics  . Smoking status: Never Smoker   . Smokeless tobacco: Not on file  . Alcohol Use: No  . Drug Use: No  . Sexual Activity: Not on file   Other Topics Concern  . Not on file   Social History Narrative  . No narrative on file   Family History  Problem Relation Age of Onset  . Coronary artery disease Mother     family history   . Heart attack Mother   . Hypertension Mother   . Heart attack Father   . Peripheral vascular disease Father   . Hypertension Father   . Coronary artery disease Brother   . Testicular cancer Brother   . Heart attack Brother 62   Current Outpatient Prescriptions on File Prior to Visit  Medication Sig Dispense Refill  . atorvastatin (LIPITOR) 20 MG tablet Take 1 tablet (20 mg total) by mouth daily.  30 tablet  5  . co-enzyme Q-10 30 MG capsule Take 30 mg by mouth 3  (three) times daily.      . Multiple Vitamin (MULTIVITAMIN WITH MINERALS) TABS tablet Take 1 tablet by mouth daily.      . Omega-3 Fatty Acids (OMEGA 3 PO) Take 2 capsules by mouth daily.       No current facility-administered medications on file prior to visit.   No Known Allergies Immunization History  Administered Date(s) Administered  . Influenza,inj,Quad PF,36+ Mos 02/01/2013  . Tdap 09/22/2012   Prior to Admission medications   Medication Sig Start Date End Date Taking? Authorizing Provider  aspirin 325 MG tablet Take by mouth daily. 80 mg daily   Yes Historical Provider, MD  atorvastatin (LIPITOR) 20 MG tablet Take 1 tablet (20 mg total) by mouth daily. 02/01/13  Yes Vernie Shanks, MD  co-enzyme Q-10 30 MG capsule Take 30 mg by mouth 3 (three) times daily.   Yes Historical Provider, MD  Multiple Vitamin (MULTIVITAMIN WITH MINERALS) TABS tablet Take 1 tablet by mouth daily.   Yes Historical Provider, MD  Omega-3 Fatty Acids (OMEGA 3 PO) Take 2 capsules by mouth daily.   Yes Historical Provider, MD     ROS: As above in the HPI. All other systems are stable or negative.  OBJECTIVE: APPEARANCE:  Patient in no acute distress.The patient appeared well nourished and normally developed. Acyanotic. Waist: VITAL SIGNS:BP 102/62  Pulse 55  Temp(Src) 98.1 F (36.7 C) (Oral)  Ht $R'5\' 11"'Pc$  (1.803 m)  Wt 157 lb (71.215 kg)  BMI 21.91 kg/m2 WM   SKIN: warm and  Dry without overt rashes, tattoos and scars  HEAD and Neck: without JVD, Head and scalp: normal Eyes:No scleral icterus. Fundi normal, eye movements normal. Ears: Auricle normal, canal normal, Tympanic membranes normal, insufflation normal. Nose: normal Throat: normal Neck & thyroid: normal  CHEST & LUNGS: Chest wall: normal Lungs: Clear  CVS: Reveals the PMI to be normally located. Regular rhythm, First and Second Heart sounds are normal,  absence of murmurs, rubs or gallops. Peripheral vasculature: Radial  pulses: normal Dorsal pedis pulses: normal Posterior pulses: normal  ABDOMEN:  Appearance: normal Benign, no organomegaly, no masses, no Abdominal Aortic enlargement. No Guarding , no rebound. No Bruits. Bowel sounds: normal  RECTAL: N/A GU: N/A  EXTREMETIES: nonedematous.  MUSCULOSKELETAL:  Spine: normal Joints: intact  NEUROLOGIC: oriented to time,place and person; nonfocal. Strength is normal Sensory is normal Reflexes are normal Cranial Nerves are normal. Results for orders placed in visit on 02/01/13  NMR, LIPOPROFILE      Result Value Ref Range   LDL Particle Number [id number]  <1000 nmol/L   LDLC SERPL CALC-MCNC 58  <100 mg/dL   HDL Cholesterol by NMR 52  >=40 mg/dL   Triglycerides by NMR 49  <150 mg/dL   Cholesterol 120  <200 mg/dL   HDL Particle Number 32.5  >=30.5 umol/L   Small LDL Particle Number 671 (*) <=527 nmol/L   LDL Size 20.1 (*) >20.5 nm   LP-IR Score 25  <=45  HEPATITIS C ANTIBODY      Result Value Ref Range   Hep C Virus Ab <0.1  0.0 - 0.9 s/co ratio  HIV ANTIBODY (ROUTINE TESTING)      Result Value Ref Range   HIV 1/O/2 Abs-Index Value <1.00  <1.00   HIV-1/HIV-2 Ab Non Reactive  Non Reactive  TSH      Result Value Ref Range   TSH 1.020  0.450 - 4.500 uIU/mL    ASSESSMENT: Hyperlipidemia - Plan: CMP14+EGFR, Lipid panel  Vegetarian diet - Plan: Vitamin B12 Stable and doing well, no problems   PLAN:  B12 checked because of tendency to vegetarian diet.   Keep on with plant based  Diet. Adequate hydration.  Orders Placed This Encounter  Procedures  . CMP14+EGFR  . Lipid panel  . Vitamin B12   Meds ordered this encounter  Medications  . aspirin 325 MG tablet    Sig: Take by mouth daily. 80 mg daily   Medications Discontinued During This Encounter  Medication Reason  . aspirin 409 MG tablet Duplicate  . aspirin tablet 325 mg Error   Return in about 6 months (around 12/07/2013) for Recheck medical problems.  Caralyn Twining P. Jacelyn Grip,  M.D.

## 2013-06-07 NOTE — Patient Instructions (Signed)
Keep on with plant based  Diet. Adequate hydration.

## 2013-06-08 LAB — CMP14+EGFR
ALT: 23 IU/L (ref 0–44)
AST: 26 IU/L (ref 0–40)
Albumin/Globulin Ratio: 2.2 (ref 1.1–2.5)
Albumin: 4.6 g/dL (ref 3.5–5.5)
Alkaline Phosphatase: 62 IU/L (ref 39–117)
BUN/Creatinine Ratio: 19 (ref 9–20)
BUN: 16 mg/dL (ref 6–24)
CO2: 30 mmol/L — ABNORMAL HIGH (ref 18–29)
Calcium: 9.7 mg/dL (ref 8.7–10.2)
Chloride: 100 mmol/L (ref 97–108)
Creatinine, Ser: 0.85 mg/dL (ref 0.76–1.27)
GFR calc Af Amer: 110 mL/min/{1.73_m2} (ref >59)
GFR calc non Af Amer: 95 mL/min/{1.73_m2} (ref >59)
Globulin, Total: 2.1 g/dL (ref 1.5–4.5)
Glucose: 94 mg/dL (ref 65–99)
Potassium: 5.1 mmol/L (ref 3.5–5.2)
Sodium: 141 mmol/L (ref 134–144)
Total Bilirubin: 0.6 mg/dL (ref 0.0–1.2)
Total Protein: 6.7 g/dL (ref 6.0–8.5)

## 2013-06-08 LAB — LIPID PANEL
Chol/HDL Ratio: 2.7 ratio units (ref 0.0–5.0)
Cholesterol, Total: 138 mg/dL (ref 100–199)
HDL: 52 mg/dL (ref >39)
LDL Calculated: 76 mg/dL (ref 0–99)
Triglycerides: 48 mg/dL (ref 0–149)
VLDL Cholesterol Cal: 10 mg/dL (ref 5–40)

## 2013-06-08 LAB — VITAMIN B12: Vitamin B-12: 917 pg/mL (ref 211–946)

## 2013-09-25 ENCOUNTER — Other Ambulatory Visit: Payer: Self-pay | Admitting: Family

## 2015-07-03 DIAGNOSIS — Z0000 Encounter for general adult medical examination without abnormal findings: Secondary | ICD-10-CM | POA: Diagnosis not present

## 2015-07-03 DIAGNOSIS — Z789 Other specified health status: Secondary | ICD-10-CM | POA: Diagnosis not present

## 2015-07-03 DIAGNOSIS — E785 Hyperlipidemia, unspecified: Secondary | ICD-10-CM | POA: Diagnosis not present

## 2015-07-03 DIAGNOSIS — Z1211 Encounter for screening for malignant neoplasm of colon: Secondary | ICD-10-CM | POA: Diagnosis not present

## 2015-07-23 DIAGNOSIS — Z1211 Encounter for screening for malignant neoplasm of colon: Secondary | ICD-10-CM | POA: Diagnosis not present

## 2015-11-19 IMAGING — CT CT HEAD W/O CM
2 series · 16 of 30 positions shown, 18 images · non-contrast
Comparison: None.

CLINICAL DATA: Near syncope

EXAM:
CT HEAD WITHOUT CONTRAST
TECHNIQUE: Contiguous axial images were obtained from the base of the skull
through the vertex without intravenous contrast.

[Series 2: head w/o · axial · non-contrast · 0.49mm/px · z∈[+826,+951]mm · 8 of 33 slices shown, 10 images]
[im 4/33  brain]
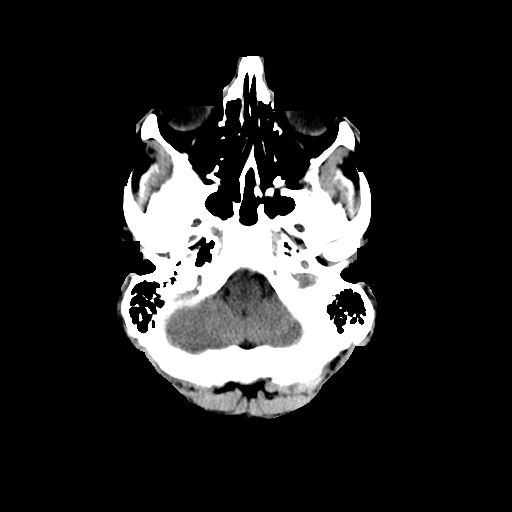
[im 4/33  bone]
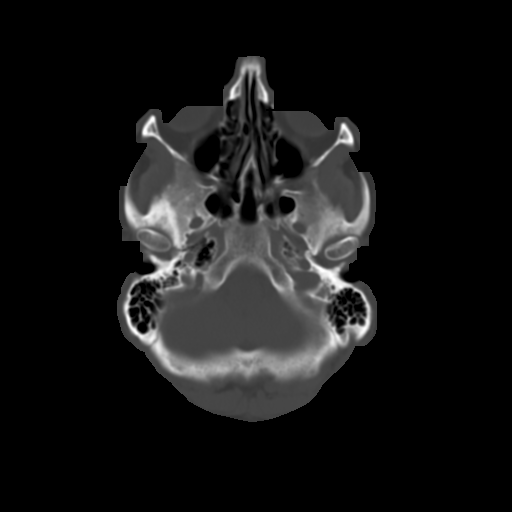
[im 8/33  brain]
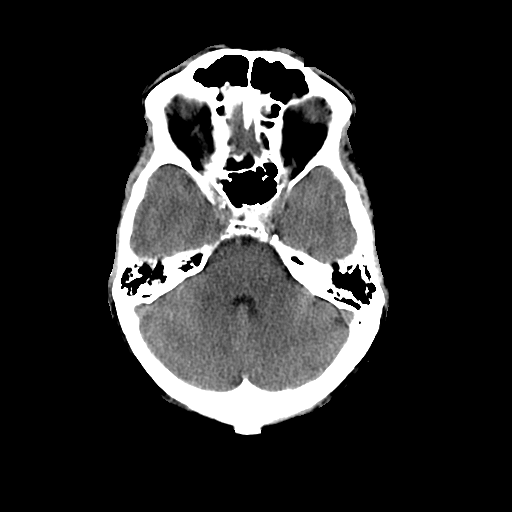
[im 11/33  brain]
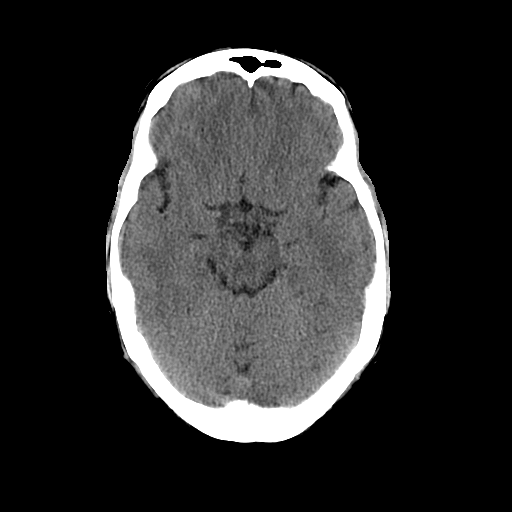
[im 15/33  brain]
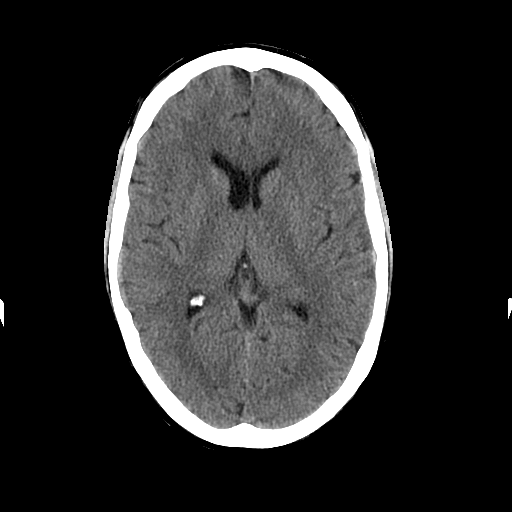
[im 18/33  brain]
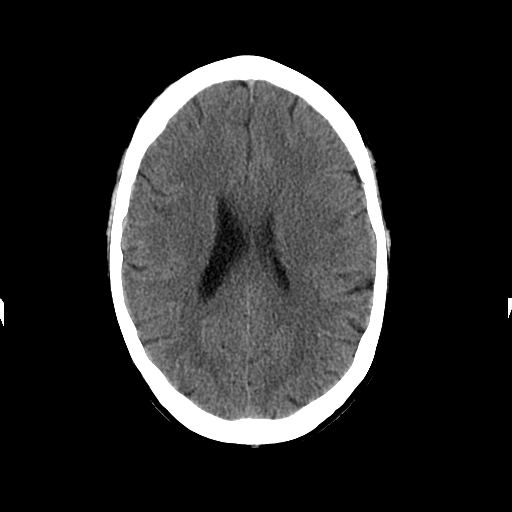
[im 18/33  bone]
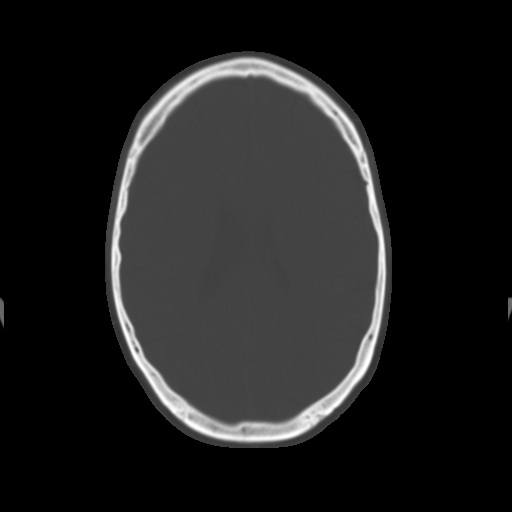
[im 22/33  brain]
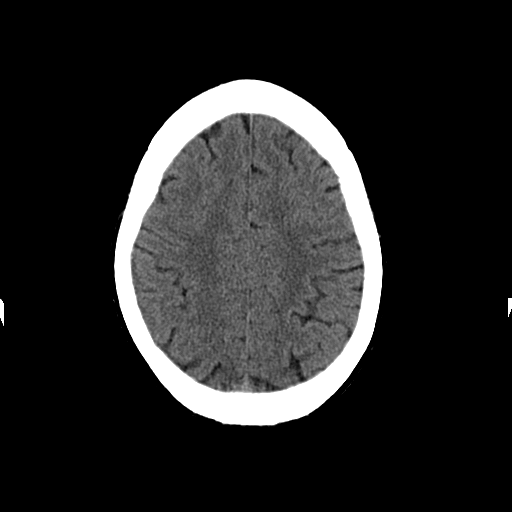
[im 25/33  brain]
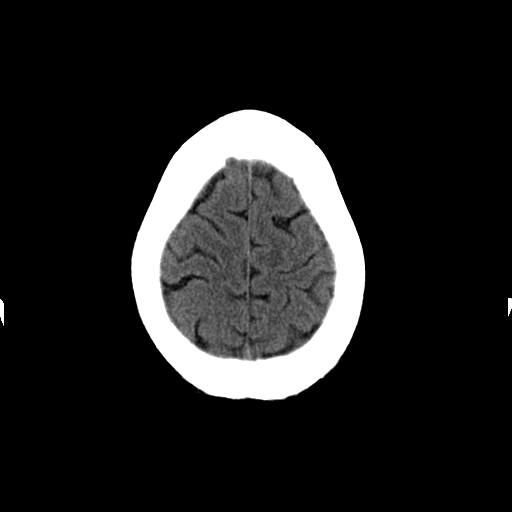
[im 29/33  brain]
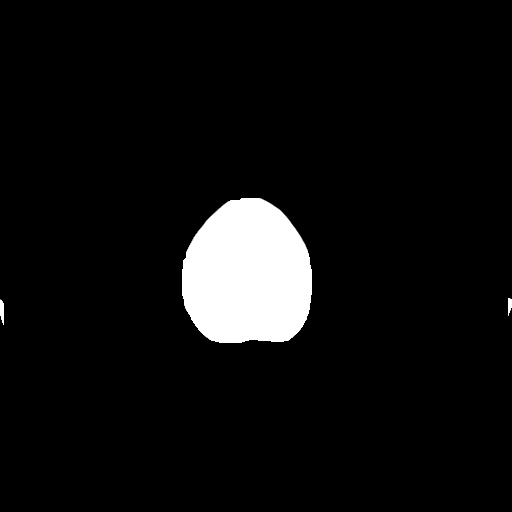

[Series 3: head w/o bone · axial · non-contrast · 0.49mm/px · z∈[+826,+953]mm · 8 of 65 slices shown]
[im 7/65  bone]
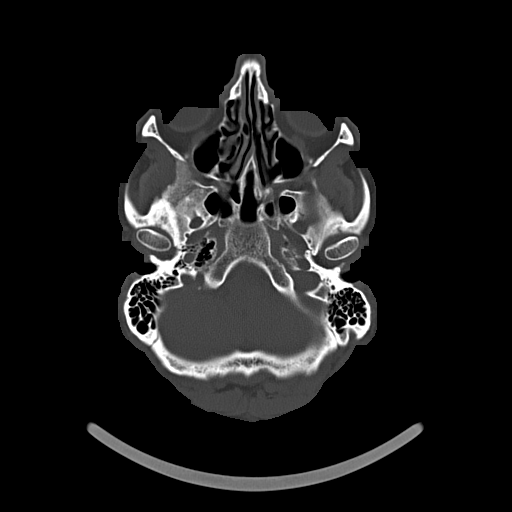
[im 14/65  bone]
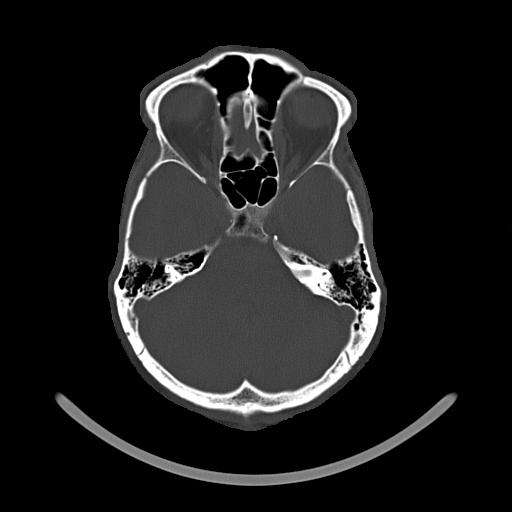
[im 21/65  bone]
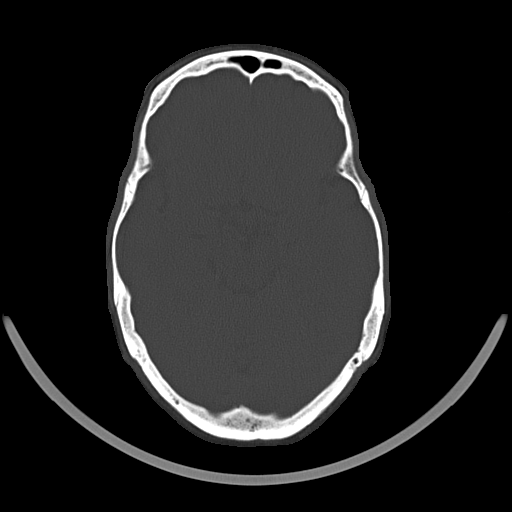
[im 27/65  bone]
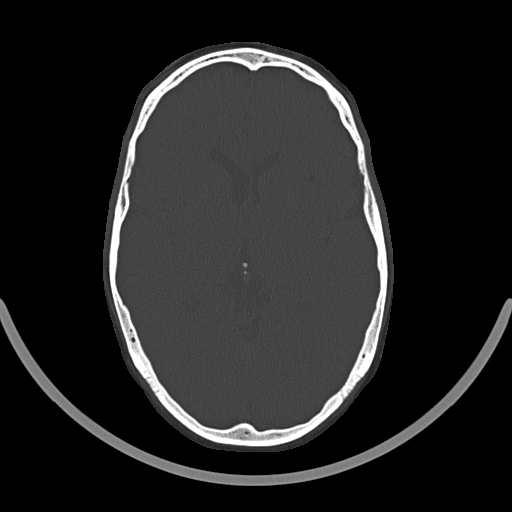
[im 38/65  bone]
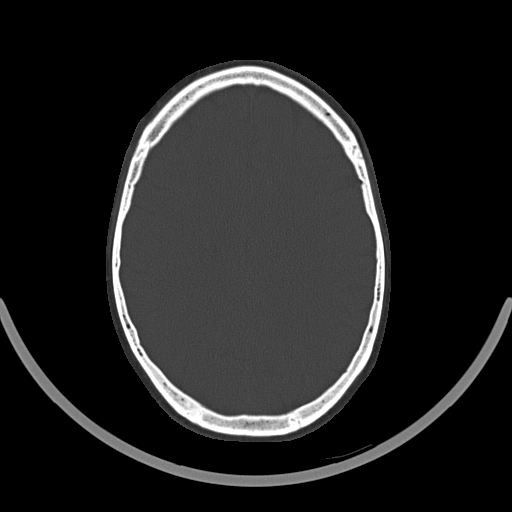
[im 44/65  bone]
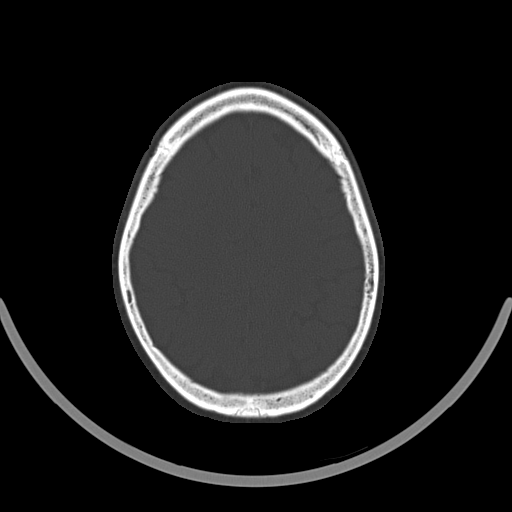
[im 51/65  bone]
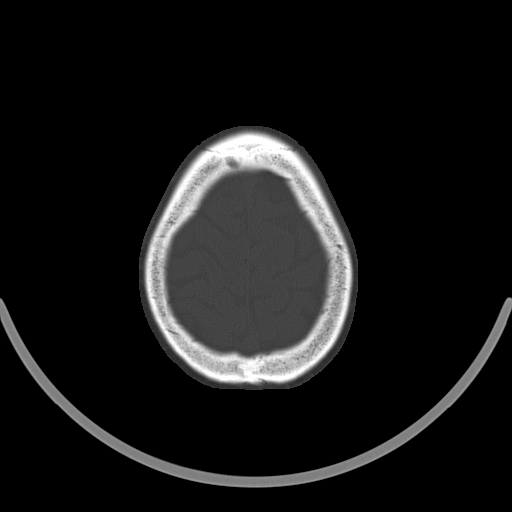
[im 58/65  bone]
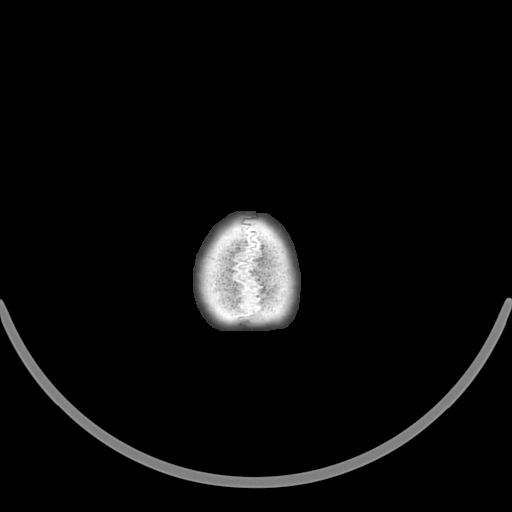

[16 of 30 positions shown; findings below may reference images not displayed]

FINDINGS: There is no midline shift, hydrocephalus, or mass. There is no acute
transcortical infarct. There is hyperdensity along the right
temporoparietal convexity seen on multiple images maximum diameter
of 2.9 mm. The bony calvarium is intact. The visualized sinuses are
clear.
IMPRESSION: Findings suspicious for right temporoparietal convexity subdural
hematoma measuring 2.9 mm.

## 2016-01-05 DIAGNOSIS — E785 Hyperlipidemia, unspecified: Secondary | ICD-10-CM | POA: Diagnosis not present

## 2016-01-05 DIAGNOSIS — Z23 Encounter for immunization: Secondary | ICD-10-CM | POA: Diagnosis not present

## 2016-01-05 DIAGNOSIS — N500 Atrophy of testis: Secondary | ICD-10-CM | POA: Diagnosis not present

## 2016-01-05 DIAGNOSIS — Z789 Other specified health status: Secondary | ICD-10-CM | POA: Diagnosis not present

## 2016-01-12 DIAGNOSIS — D126 Benign neoplasm of colon, unspecified: Secondary | ICD-10-CM | POA: Diagnosis not present

## 2016-01-12 DIAGNOSIS — Z1211 Encounter for screening for malignant neoplasm of colon: Secondary | ICD-10-CM | POA: Diagnosis not present

## 2016-01-12 DIAGNOSIS — D122 Benign neoplasm of ascending colon: Secondary | ICD-10-CM | POA: Diagnosis not present

## 2016-01-15 DIAGNOSIS — D126 Benign neoplasm of colon, unspecified: Secondary | ICD-10-CM | POA: Diagnosis not present

## 2016-01-15 DIAGNOSIS — Z1211 Encounter for screening for malignant neoplasm of colon: Secondary | ICD-10-CM | POA: Diagnosis not present

## 2016-07-06 DIAGNOSIS — L820 Inflamed seborrheic keratosis: Secondary | ICD-10-CM | POA: Diagnosis not present

## 2016-07-16 DIAGNOSIS — E785 Hyperlipidemia, unspecified: Secondary | ICD-10-CM | POA: Diagnosis not present

## 2016-07-16 DIAGNOSIS — E538 Deficiency of other specified B group vitamins: Secondary | ICD-10-CM | POA: Diagnosis not present

## 2016-07-16 DIAGNOSIS — Z125 Encounter for screening for malignant neoplasm of prostate: Secondary | ICD-10-CM | POA: Diagnosis not present

## 2017-03-15 DIAGNOSIS — E785 Hyperlipidemia, unspecified: Secondary | ICD-10-CM | POA: Diagnosis not present

## 2017-03-15 DIAGNOSIS — K219 Gastro-esophageal reflux disease without esophagitis: Secondary | ICD-10-CM | POA: Diagnosis not present

## 2017-03-15 DIAGNOSIS — Z0000 Encounter for general adult medical examination without abnormal findings: Secondary | ICD-10-CM | POA: Diagnosis not present

## 2017-03-15 DIAGNOSIS — E538 Deficiency of other specified B group vitamins: Secondary | ICD-10-CM | POA: Diagnosis not present

## 2017-09-15 DIAGNOSIS — E538 Deficiency of other specified B group vitamins: Secondary | ICD-10-CM | POA: Diagnosis not present

## 2017-09-15 DIAGNOSIS — Z789 Other specified health status: Secondary | ICD-10-CM | POA: Diagnosis not present

## 2017-09-15 DIAGNOSIS — E785 Hyperlipidemia, unspecified: Secondary | ICD-10-CM | POA: Diagnosis not present

## 2018-02-23 DIAGNOSIS — D225 Melanocytic nevi of trunk: Secondary | ICD-10-CM | POA: Diagnosis not present

## 2018-02-23 DIAGNOSIS — X32XXXA Exposure to sunlight, initial encounter: Secondary | ICD-10-CM | POA: Diagnosis not present

## 2018-02-23 DIAGNOSIS — D485 Neoplasm of uncertain behavior of skin: Secondary | ICD-10-CM | POA: Diagnosis not present

## 2018-02-23 DIAGNOSIS — D034 Melanoma in situ of scalp and neck: Secondary | ICD-10-CM | POA: Diagnosis not present

## 2018-02-23 DIAGNOSIS — L821 Other seborrheic keratosis: Secondary | ICD-10-CM | POA: Diagnosis not present

## 2018-02-23 DIAGNOSIS — L570 Actinic keratosis: Secondary | ICD-10-CM | POA: Diagnosis not present

## 2018-02-23 DIAGNOSIS — D2261 Melanocytic nevi of right upper limb, including shoulder: Secondary | ICD-10-CM | POA: Diagnosis not present

## 2018-03-02 DIAGNOSIS — D034 Melanoma in situ of scalp and neck: Secondary | ICD-10-CM | POA: Diagnosis not present

## 2018-03-27 DIAGNOSIS — Z0000 Encounter for general adult medical examination without abnormal findings: Secondary | ICD-10-CM | POA: Diagnosis not present

## 2018-03-27 DIAGNOSIS — E538 Deficiency of other specified B group vitamins: Secondary | ICD-10-CM | POA: Diagnosis not present

## 2018-03-27 DIAGNOSIS — E785 Hyperlipidemia, unspecified: Secondary | ICD-10-CM | POA: Diagnosis not present

## 2018-03-27 DIAGNOSIS — Z125 Encounter for screening for malignant neoplasm of prostate: Secondary | ICD-10-CM | POA: Diagnosis not present

## 2018-03-27 DIAGNOSIS — Z1159 Encounter for screening for other viral diseases: Secondary | ICD-10-CM | POA: Diagnosis not present

## 2018-06-15 DIAGNOSIS — Z1283 Encounter for screening for malignant neoplasm of skin: Secondary | ICD-10-CM | POA: Diagnosis not present

## 2018-06-15 DIAGNOSIS — Z85820 Personal history of malignant melanoma of skin: Secondary | ICD-10-CM | POA: Diagnosis not present

## 2018-06-15 DIAGNOSIS — Z08 Encounter for follow-up examination after completed treatment for malignant neoplasm: Secondary | ICD-10-CM | POA: Diagnosis not present

## 2018-09-14 DIAGNOSIS — Z08 Encounter for follow-up examination after completed treatment for malignant neoplasm: Secondary | ICD-10-CM | POA: Diagnosis not present

## 2018-09-14 DIAGNOSIS — D225 Melanocytic nevi of trunk: Secondary | ICD-10-CM | POA: Diagnosis not present

## 2018-09-14 DIAGNOSIS — Z85820 Personal history of malignant melanoma of skin: Secondary | ICD-10-CM | POA: Diagnosis not present

## 2018-09-14 DIAGNOSIS — Z1283 Encounter for screening for malignant neoplasm of skin: Secondary | ICD-10-CM | POA: Diagnosis not present

## 2018-09-14 DIAGNOSIS — X32XXXD Exposure to sunlight, subsequent encounter: Secondary | ICD-10-CM | POA: Diagnosis not present

## 2018-09-14 DIAGNOSIS — L570 Actinic keratosis: Secondary | ICD-10-CM | POA: Diagnosis not present

## 2018-09-25 DIAGNOSIS — E785 Hyperlipidemia, unspecified: Secondary | ICD-10-CM | POA: Diagnosis not present

## 2018-09-25 DIAGNOSIS — Z789 Other specified health status: Secondary | ICD-10-CM | POA: Diagnosis not present

## 2018-09-25 DIAGNOSIS — K219 Gastro-esophageal reflux disease without esophagitis: Secondary | ICD-10-CM | POA: Diagnosis not present

## 2018-09-25 DIAGNOSIS — E538 Deficiency of other specified B group vitamins: Secondary | ICD-10-CM | POA: Diagnosis not present

## 2018-09-27 DIAGNOSIS — E538 Deficiency of other specified B group vitamins: Secondary | ICD-10-CM | POA: Diagnosis not present

## 2018-09-27 DIAGNOSIS — E785 Hyperlipidemia, unspecified: Secondary | ICD-10-CM | POA: Diagnosis not present

## 2018-12-18 DIAGNOSIS — Z85820 Personal history of malignant melanoma of skin: Secondary | ICD-10-CM | POA: Diagnosis not present

## 2018-12-18 DIAGNOSIS — Z08 Encounter for follow-up examination after completed treatment for malignant neoplasm: Secondary | ICD-10-CM | POA: Diagnosis not present

## 2018-12-18 DIAGNOSIS — D225 Melanocytic nevi of trunk: Secondary | ICD-10-CM | POA: Diagnosis not present

## 2018-12-18 DIAGNOSIS — Z1283 Encounter for screening for malignant neoplasm of skin: Secondary | ICD-10-CM | POA: Diagnosis not present

## 2018-12-18 DIAGNOSIS — L820 Inflamed seborrheic keratosis: Secondary | ICD-10-CM | POA: Diagnosis not present

## 2018-12-29 DIAGNOSIS — Z23 Encounter for immunization: Secondary | ICD-10-CM | POA: Diagnosis not present

## 2019-04-26 DIAGNOSIS — D225 Melanocytic nevi of trunk: Secondary | ICD-10-CM | POA: Diagnosis not present

## 2019-04-26 DIAGNOSIS — Z1283 Encounter for screening for malignant neoplasm of skin: Secondary | ICD-10-CM | POA: Diagnosis not present

## 2019-04-26 DIAGNOSIS — Z85820 Personal history of malignant melanoma of skin: Secondary | ICD-10-CM | POA: Diagnosis not present

## 2019-04-26 DIAGNOSIS — Z08 Encounter for follow-up examination after completed treatment for malignant neoplasm: Secondary | ICD-10-CM | POA: Diagnosis not present

## 2019-05-22 DIAGNOSIS — Z0120 Encounter for dental examination and cleaning without abnormal findings: Secondary | ICD-10-CM | POA: Diagnosis not present

## 2019-05-29 DIAGNOSIS — E785 Hyperlipidemia, unspecified: Secondary | ICD-10-CM | POA: Diagnosis not present

## 2019-05-29 DIAGNOSIS — Z0000 Encounter for general adult medical examination without abnormal findings: Secondary | ICD-10-CM | POA: Diagnosis not present

## 2019-05-29 DIAGNOSIS — Z1211 Encounter for screening for malignant neoplasm of colon: Secondary | ICD-10-CM | POA: Diagnosis not present

## 2019-05-29 DIAGNOSIS — Z789 Other specified health status: Secondary | ICD-10-CM | POA: Diagnosis not present

## 2019-07-30 DIAGNOSIS — L821 Other seborrheic keratosis: Secondary | ICD-10-CM | POA: Diagnosis not present

## 2019-07-30 DIAGNOSIS — Z1283 Encounter for screening for malignant neoplasm of skin: Secondary | ICD-10-CM | POA: Diagnosis not present

## 2019-07-30 DIAGNOSIS — Z86006 Personal history of melanoma in-situ: Secondary | ICD-10-CM | POA: Diagnosis not present

## 2019-07-30 DIAGNOSIS — Z08 Encounter for follow-up examination after completed treatment for malignant neoplasm: Secondary | ICD-10-CM | POA: Diagnosis not present

## 2019-10-29 DIAGNOSIS — Z85820 Personal history of malignant melanoma of skin: Secondary | ICD-10-CM | POA: Diagnosis not present

## 2019-10-29 DIAGNOSIS — Z08 Encounter for follow-up examination after completed treatment for malignant neoplasm: Secondary | ICD-10-CM | POA: Diagnosis not present

## 2019-10-29 DIAGNOSIS — D225 Melanocytic nevi of trunk: Secondary | ICD-10-CM | POA: Diagnosis not present

## 2019-10-29 DIAGNOSIS — Z1283 Encounter for screening for malignant neoplasm of skin: Secondary | ICD-10-CM | POA: Diagnosis not present

## 2019-11-23 DIAGNOSIS — Z0120 Encounter for dental examination and cleaning without abnormal findings: Secondary | ICD-10-CM | POA: Diagnosis not present

## 2019-11-27 DIAGNOSIS — I4891 Unspecified atrial fibrillation: Secondary | ICD-10-CM | POA: Diagnosis not present

## 2019-11-27 DIAGNOSIS — Z7901 Long term (current) use of anticoagulants: Secondary | ICD-10-CM | POA: Diagnosis not present

## 2019-11-27 DIAGNOSIS — D122 Benign neoplasm of ascending colon: Secondary | ICD-10-CM | POA: Diagnosis not present

## 2019-12-03 DIAGNOSIS — N500 Atrophy of testis: Secondary | ICD-10-CM | POA: Diagnosis not present

## 2019-12-03 DIAGNOSIS — E785 Hyperlipidemia, unspecified: Secondary | ICD-10-CM | POA: Diagnosis not present

## 2019-12-03 DIAGNOSIS — Z789 Other specified health status: Secondary | ICD-10-CM | POA: Diagnosis not present

## 2019-12-03 DIAGNOSIS — Z23 Encounter for immunization: Secondary | ICD-10-CM | POA: Diagnosis not present

## 2019-12-24 DIAGNOSIS — Z23 Encounter for immunization: Secondary | ICD-10-CM | POA: Diagnosis not present

## 2019-12-26 DIAGNOSIS — H524 Presbyopia: Secondary | ICD-10-CM | POA: Diagnosis not present

## 2020-02-04 DIAGNOSIS — Z1283 Encounter for screening for malignant neoplasm of skin: Secondary | ICD-10-CM | POA: Diagnosis not present

## 2020-02-04 DIAGNOSIS — Z85820 Personal history of malignant melanoma of skin: Secondary | ICD-10-CM | POA: Diagnosis not present

## 2020-02-04 DIAGNOSIS — D225 Melanocytic nevi of trunk: Secondary | ICD-10-CM | POA: Diagnosis not present

## 2020-02-04 DIAGNOSIS — Z08 Encounter for follow-up examination after completed treatment for malignant neoplasm: Secondary | ICD-10-CM | POA: Diagnosis not present

## 2020-02-11 DIAGNOSIS — H524 Presbyopia: Secondary | ICD-10-CM | POA: Diagnosis not present

## 2020-06-03 DIAGNOSIS — N500 Atrophy of testis: Secondary | ICD-10-CM | POA: Diagnosis not present

## 2020-06-03 DIAGNOSIS — Z789 Other specified health status: Secondary | ICD-10-CM | POA: Diagnosis not present

## 2020-06-03 DIAGNOSIS — K219 Gastro-esophageal reflux disease without esophagitis: Secondary | ICD-10-CM | POA: Diagnosis not present

## 2020-06-03 DIAGNOSIS — E785 Hyperlipidemia, unspecified: Secondary | ICD-10-CM | POA: Diagnosis not present

## 2020-08-14 DIAGNOSIS — Z1283 Encounter for screening for malignant neoplasm of skin: Secondary | ICD-10-CM | POA: Diagnosis not present

## 2020-08-14 DIAGNOSIS — D225 Melanocytic nevi of trunk: Secondary | ICD-10-CM | POA: Diagnosis not present

## 2020-08-14 DIAGNOSIS — Z85820 Personal history of malignant melanoma of skin: Secondary | ICD-10-CM | POA: Diagnosis not present

## 2020-08-14 DIAGNOSIS — L821 Other seborrheic keratosis: Secondary | ICD-10-CM | POA: Diagnosis not present

## 2020-08-14 DIAGNOSIS — Z08 Encounter for follow-up examination after completed treatment for malignant neoplasm: Secondary | ICD-10-CM | POA: Diagnosis not present

## 2020-12-04 DIAGNOSIS — Z789 Other specified health status: Secondary | ICD-10-CM | POA: Diagnosis not present

## 2020-12-04 DIAGNOSIS — R739 Hyperglycemia, unspecified: Secondary | ICD-10-CM | POA: Diagnosis not present

## 2020-12-04 DIAGNOSIS — Z23 Encounter for immunization: Secondary | ICD-10-CM | POA: Diagnosis not present

## 2020-12-04 DIAGNOSIS — E538 Deficiency of other specified B group vitamins: Secondary | ICD-10-CM | POA: Diagnosis not present

## 2020-12-04 DIAGNOSIS — N500 Atrophy of testis: Secondary | ICD-10-CM | POA: Diagnosis not present

## 2020-12-04 DIAGNOSIS — E785 Hyperlipidemia, unspecified: Secondary | ICD-10-CM | POA: Diagnosis not present

## 2021-02-23 DIAGNOSIS — Z1283 Encounter for screening for malignant neoplasm of skin: Secondary | ICD-10-CM | POA: Diagnosis not present

## 2021-02-23 DIAGNOSIS — Z85820 Personal history of malignant melanoma of skin: Secondary | ICD-10-CM | POA: Diagnosis not present

## 2021-02-23 DIAGNOSIS — Z08 Encounter for follow-up examination after completed treatment for malignant neoplasm: Secondary | ICD-10-CM | POA: Diagnosis not present

## 2021-03-03 DIAGNOSIS — Z86010 Personal history of colonic polyps: Secondary | ICD-10-CM | POA: Diagnosis not present

## 2021-06-04 DIAGNOSIS — Z789 Other specified health status: Secondary | ICD-10-CM | POA: Diagnosis not present

## 2021-06-04 DIAGNOSIS — Z5181 Encounter for therapeutic drug level monitoring: Secondary | ICD-10-CM | POA: Diagnosis not present

## 2021-06-04 DIAGNOSIS — E785 Hyperlipidemia, unspecified: Secondary | ICD-10-CM | POA: Diagnosis not present

## 2021-06-04 DIAGNOSIS — E538 Deficiency of other specified B group vitamins: Secondary | ICD-10-CM | POA: Diagnosis not present

## 2021-06-04 DIAGNOSIS — Z0000 Encounter for general adult medical examination without abnormal findings: Secondary | ICD-10-CM | POA: Diagnosis not present

## 2021-06-04 DIAGNOSIS — Z125 Encounter for screening for malignant neoplasm of prostate: Secondary | ICD-10-CM | POA: Diagnosis not present

## 2021-08-24 DIAGNOSIS — D225 Melanocytic nevi of trunk: Secondary | ICD-10-CM | POA: Diagnosis not present

## 2021-08-24 DIAGNOSIS — Z85820 Personal history of malignant melanoma of skin: Secondary | ICD-10-CM | POA: Diagnosis not present

## 2021-08-24 DIAGNOSIS — Z1283 Encounter for screening for malignant neoplasm of skin: Secondary | ICD-10-CM | POA: Diagnosis not present

## 2021-08-24 DIAGNOSIS — Z08 Encounter for follow-up examination after completed treatment for malignant neoplasm: Secondary | ICD-10-CM | POA: Diagnosis not present

## 2021-12-01 DIAGNOSIS — Z6825 Body mass index (BMI) 25.0-25.9, adult: Secondary | ICD-10-CM | POA: Diagnosis not present

## 2021-12-01 DIAGNOSIS — Z23 Encounter for immunization: Secondary | ICD-10-CM | POA: Diagnosis not present

## 2021-12-01 DIAGNOSIS — E785 Hyperlipidemia, unspecified: Secondary | ICD-10-CM | POA: Diagnosis not present

## 2021-12-07 DIAGNOSIS — L821 Other seborrheic keratosis: Secondary | ICD-10-CM | POA: Diagnosis not present

## 2021-12-07 DIAGNOSIS — L918 Other hypertrophic disorders of the skin: Secondary | ICD-10-CM | POA: Diagnosis not present

## 2021-12-07 DIAGNOSIS — D225 Melanocytic nevi of trunk: Secondary | ICD-10-CM | POA: Diagnosis not present

## 2022-02-22 DIAGNOSIS — D2321 Other benign neoplasm of skin of right ear and external auricular canal: Secondary | ICD-10-CM | POA: Diagnosis not present

## 2022-02-22 DIAGNOSIS — Z1283 Encounter for screening for malignant neoplasm of skin: Secondary | ICD-10-CM | POA: Diagnosis not present

## 2022-02-22 DIAGNOSIS — D225 Melanocytic nevi of trunk: Secondary | ICD-10-CM | POA: Diagnosis not present

## 2022-02-22 DIAGNOSIS — L821 Other seborrheic keratosis: Secondary | ICD-10-CM | POA: Diagnosis not present

## 2022-02-22 DIAGNOSIS — D485 Neoplasm of uncertain behavior of skin: Secondary | ICD-10-CM | POA: Diagnosis not present

## 2022-02-22 DIAGNOSIS — Z08 Encounter for follow-up examination after completed treatment for malignant neoplasm: Secondary | ICD-10-CM | POA: Diagnosis not present

## 2022-02-22 DIAGNOSIS — L820 Inflamed seborrheic keratosis: Secondary | ICD-10-CM | POA: Diagnosis not present

## 2022-02-22 DIAGNOSIS — Z85820 Personal history of malignant melanoma of skin: Secondary | ICD-10-CM | POA: Diagnosis not present

## 2022-03-05 DIAGNOSIS — K080 Exfoliation of teeth due to systemic causes: Secondary | ICD-10-CM | POA: Diagnosis not present

## 2022-04-28 DIAGNOSIS — H2513 Age-related nuclear cataract, bilateral: Secondary | ICD-10-CM | POA: Diagnosis not present

## 2022-04-28 DIAGNOSIS — H5203 Hypermetropia, bilateral: Secondary | ICD-10-CM | POA: Diagnosis not present

## 2022-04-28 DIAGNOSIS — H40033 Anatomical narrow angle, bilateral: Secondary | ICD-10-CM | POA: Diagnosis not present

## 2022-05-13 DIAGNOSIS — K080 Exfoliation of teeth due to systemic causes: Secondary | ICD-10-CM | POA: Diagnosis not present

## 2022-06-10 DIAGNOSIS — Z125 Encounter for screening for malignant neoplasm of prostate: Secondary | ICD-10-CM | POA: Diagnosis not present

## 2022-06-10 DIAGNOSIS — E785 Hyperlipidemia, unspecified: Secondary | ICD-10-CM | POA: Diagnosis not present

## 2022-06-10 DIAGNOSIS — K219 Gastro-esophageal reflux disease without esophagitis: Secondary | ICD-10-CM | POA: Diagnosis not present

## 2022-06-10 DIAGNOSIS — R351 Nocturia: Secondary | ICD-10-CM | POA: Diagnosis not present

## 2022-06-10 DIAGNOSIS — Z23 Encounter for immunization: Secondary | ICD-10-CM | POA: Diagnosis not present

## 2022-06-10 DIAGNOSIS — Z0000 Encounter for general adult medical examination without abnormal findings: Secondary | ICD-10-CM | POA: Diagnosis not present

## 2022-08-10 DIAGNOSIS — K080 Exfoliation of teeth due to systemic causes: Secondary | ICD-10-CM | POA: Diagnosis not present

## 2022-09-06 DIAGNOSIS — L570 Actinic keratosis: Secondary | ICD-10-CM | POA: Diagnosis not present

## 2022-09-06 DIAGNOSIS — X32XXXD Exposure to sunlight, subsequent encounter: Secondary | ICD-10-CM | POA: Diagnosis not present

## 2022-09-06 DIAGNOSIS — D225 Melanocytic nevi of trunk: Secondary | ICD-10-CM | POA: Diagnosis not present

## 2022-09-06 DIAGNOSIS — Z1283 Encounter for screening for malignant neoplasm of skin: Secondary | ICD-10-CM | POA: Diagnosis not present

## 2022-09-06 DIAGNOSIS — Z85820 Personal history of malignant melanoma of skin: Secondary | ICD-10-CM | POA: Diagnosis not present

## 2022-09-06 DIAGNOSIS — Z08 Encounter for follow-up examination after completed treatment for malignant neoplasm: Secondary | ICD-10-CM | POA: Diagnosis not present

## 2022-12-10 DIAGNOSIS — K219 Gastro-esophageal reflux disease without esophagitis: Secondary | ICD-10-CM | POA: Diagnosis not present

## 2022-12-10 DIAGNOSIS — M19019 Primary osteoarthritis, unspecified shoulder: Secondary | ICD-10-CM | POA: Diagnosis not present

## 2022-12-10 DIAGNOSIS — E785 Hyperlipidemia, unspecified: Secondary | ICD-10-CM | POA: Diagnosis not present

## 2022-12-10 DIAGNOSIS — E538 Deficiency of other specified B group vitamins: Secondary | ICD-10-CM | POA: Diagnosis not present

## 2023-03-07 DIAGNOSIS — K080 Exfoliation of teeth due to systemic causes: Secondary | ICD-10-CM | POA: Diagnosis not present

## 2023-03-10 DIAGNOSIS — D225 Melanocytic nevi of trunk: Secondary | ICD-10-CM | POA: Diagnosis not present

## 2023-03-10 DIAGNOSIS — X32XXXD Exposure to sunlight, subsequent encounter: Secondary | ICD-10-CM | POA: Diagnosis not present

## 2023-03-10 DIAGNOSIS — Z08 Encounter for follow-up examination after completed treatment for malignant neoplasm: Secondary | ICD-10-CM | POA: Diagnosis not present

## 2023-03-10 DIAGNOSIS — D1809 Hemangioma of other sites: Secondary | ICD-10-CM | POA: Diagnosis not present

## 2023-03-10 DIAGNOSIS — D485 Neoplasm of uncertain behavior of skin: Secondary | ICD-10-CM | POA: Diagnosis not present

## 2023-03-10 DIAGNOSIS — L570 Actinic keratosis: Secondary | ICD-10-CM | POA: Diagnosis not present

## 2023-03-10 DIAGNOSIS — Z85820 Personal history of malignant melanoma of skin: Secondary | ICD-10-CM | POA: Diagnosis not present

## 2023-03-10 DIAGNOSIS — Z1283 Encounter for screening for malignant neoplasm of skin: Secondary | ICD-10-CM | POA: Diagnosis not present

## 2023-06-16 DIAGNOSIS — E538 Deficiency of other specified B group vitamins: Secondary | ICD-10-CM | POA: Diagnosis not present

## 2023-06-16 DIAGNOSIS — Z0000 Encounter for general adult medical examination without abnormal findings: Secondary | ICD-10-CM | POA: Diagnosis not present

## 2023-06-16 DIAGNOSIS — E785 Hyperlipidemia, unspecified: Secondary | ICD-10-CM | POA: Diagnosis not present

## 2023-06-16 DIAGNOSIS — Z789 Other specified health status: Secondary | ICD-10-CM | POA: Diagnosis not present

## 2023-06-16 DIAGNOSIS — Z125 Encounter for screening for malignant neoplasm of prostate: Secondary | ICD-10-CM | POA: Diagnosis not present

## 2023-06-22 DIAGNOSIS — K080 Exfoliation of teeth due to systemic causes: Secondary | ICD-10-CM | POA: Diagnosis not present

## 2023-07-28 DIAGNOSIS — H00024 Hordeolum internum left upper eyelid: Secondary | ICD-10-CM | POA: Diagnosis not present

## 2023-08-20 DIAGNOSIS — H04123 Dry eye syndrome of bilateral lacrimal glands: Secondary | ICD-10-CM | POA: Diagnosis not present

## 2023-08-20 DIAGNOSIS — H40033 Anatomical narrow angle, bilateral: Secondary | ICD-10-CM | POA: Diagnosis not present

## 2023-09-07 DIAGNOSIS — K080 Exfoliation of teeth due to systemic causes: Secondary | ICD-10-CM | POA: Diagnosis not present

## 2023-09-08 DIAGNOSIS — Z1283 Encounter for screening for malignant neoplasm of skin: Secondary | ICD-10-CM | POA: Diagnosis not present

## 2023-09-08 DIAGNOSIS — Z08 Encounter for follow-up examination after completed treatment for malignant neoplasm: Secondary | ICD-10-CM | POA: Diagnosis not present

## 2023-09-08 DIAGNOSIS — C44519 Basal cell carcinoma of skin of other part of trunk: Secondary | ICD-10-CM | POA: Diagnosis not present

## 2023-09-08 DIAGNOSIS — X32XXXD Exposure to sunlight, subsequent encounter: Secondary | ICD-10-CM | POA: Diagnosis not present

## 2023-09-08 DIAGNOSIS — D225 Melanocytic nevi of trunk: Secondary | ICD-10-CM | POA: Diagnosis not present

## 2023-09-08 DIAGNOSIS — L570 Actinic keratosis: Secondary | ICD-10-CM | POA: Diagnosis not present

## 2023-09-08 DIAGNOSIS — Z85820 Personal history of malignant melanoma of skin: Secondary | ICD-10-CM | POA: Diagnosis not present

## 2023-10-31 DIAGNOSIS — Z85820 Personal history of malignant melanoma of skin: Secondary | ICD-10-CM | POA: Diagnosis not present

## 2023-10-31 DIAGNOSIS — Z85828 Personal history of other malignant neoplasm of skin: Secondary | ICD-10-CM | POA: Diagnosis not present

## 2023-10-31 DIAGNOSIS — Z08 Encounter for follow-up examination after completed treatment for malignant neoplasm: Secondary | ICD-10-CM | POA: Diagnosis not present
